# Patient Record
Sex: Male | Born: 1970 | Race: Black or African American | Hispanic: No | Marital: Single | Smoking: Never smoker
Health system: Southern US, Community
[De-identification: ages and names within clinical notes are randomized; demographics above are authoritative.]

## PROBLEM LIST (undated history)

## (undated) DIAGNOSIS — K37 Unspecified appendicitis: Secondary | ICD-10-CM

## (undated) HISTORY — DX: Unspecified appendicitis: K37

---

## 2016-08-31 ENCOUNTER — Encounter
Admission: EM | Disposition: A | Discharge status: Home / Self Care | Payer: Self-pay | Source: Home / Self Care | Attending: Emergency Medicine

## 2016-08-31 ENCOUNTER — Observation Stay: Payer: PRIVATE HEALTH INSURANCE | Admitting: Anesthesiology

## 2016-08-31 ENCOUNTER — Observation Stay
Admission: EM | Admit: 2016-08-31 | Discharge: 2016-09-01 | Disposition: A | Discharge status: Home / Self Care | Payer: PRIVATE HEALTH INSURANCE | Attending: Surgery | Admitting: Surgery

## 2016-08-31 ENCOUNTER — Emergency Department: Payer: PRIVATE HEALTH INSURANCE

## 2016-08-31 DIAGNOSIS — K3580 Unspecified acute appendicitis: Secondary | ICD-10-CM

## 2016-08-31 DIAGNOSIS — K3589 Other acute appendicitis: Principal | ICD-10-CM | POA: Insufficient documentation

## 2016-08-31 DIAGNOSIS — K353 Acute appendicitis with localized peritonitis, without perforation or gangrene: Secondary | ICD-10-CM

## 2016-08-31 DIAGNOSIS — I10 Essential (primary) hypertension: Secondary | ICD-10-CM | POA: Insufficient documentation

## 2016-08-31 DIAGNOSIS — K37 Unspecified appendicitis: Secondary | ICD-10-CM

## 2016-08-31 HISTORY — DX: Unspecified appendicitis: K37

## 2016-08-31 HISTORY — PX: LAPAROSCOPIC APPENDECTOMY: SHX408

## 2016-08-31 LAB — COMPREHENSIVE METABOLIC PANEL
ALT: 23 U/L (ref 17–63)
ANION GAP: 6 (ref 5–15)
AST: 26 U/L (ref 15–41)
Albumin: 4.3 g/dL (ref 3.5–5.0)
Alkaline Phosphatase: 57 U/L (ref 38–126)
BUN: 12 mg/dL (ref 6–20)
CHLORIDE: 104 mmol/L (ref 101–111)
CO2: 25 mmol/L (ref 22–32)
Calcium: 9.6 mg/dL (ref 8.9–10.3)
Creatinine, Ser: 1.2 mg/dL (ref 0.61–1.24)
Glucose, Bld: 116 mg/dL — ABNORMAL HIGH (ref 65–99)
Potassium: 3.6 mmol/L (ref 3.5–5.1)
Sodium: 135 mmol/L (ref 135–145)
Total Bilirubin: 1 mg/dL (ref 0.3–1.2)
Total Protein: 8.3 g/dL — ABNORMAL HIGH (ref 6.5–8.1)

## 2016-08-31 LAB — URINALYSIS, COMPLETE (UACMP) WITH MICROSCOPIC
BACTERIA UA: NONE SEEN
Bilirubin Urine: NEGATIVE
Glucose, UA: NEGATIVE mg/dL
Ketones, ur: NEGATIVE mg/dL
LEUKOCYTES UA: NEGATIVE
Nitrite: NEGATIVE
PH: 5 (ref 5.0–8.0)
Protein, ur: NEGATIVE mg/dL
SQUAMOUS EPITHELIAL / LPF: NONE SEEN
Specific Gravity, Urine: 1.016 (ref 1.005–1.030)

## 2016-08-31 LAB — CBC
HEMATOCRIT: 43.7 % (ref 40.0–52.0)
Hemoglobin: 14.7 g/dL (ref 13.0–18.0)
MCH: 30.1 pg (ref 26.0–34.0)
MCHC: 33.6 g/dL (ref 32.0–36.0)
MCV: 89.6 fL (ref 80.0–100.0)
PLATELETS: 203 10*3/uL (ref 150–440)
RBC: 4.88 MIL/uL (ref 4.40–5.90)
RDW: 14.1 % (ref 11.5–14.5)
WBC: 11.9 10*3/uL — AB (ref 3.8–10.6)

## 2016-08-31 LAB — LIPASE, BLOOD: LIPASE: 18 U/L (ref 11–51)

## 2016-08-31 SURGERY — APPENDECTOMY, LAPAROSCOPIC
Anesthesia: General

## 2016-08-31 MED ORDER — MORPHINE SULFATE (PF) 4 MG/ML IV SOLN
4.0000 mg | INTRAVENOUS | Status: DC | PRN
  Administered 2016-08-31: 4 mg via INTRAVENOUS

## 2016-08-31 MED ORDER — IOPAMIDOL (ISOVUE-300) INJECTION 61%
30.0000 mL | Freq: Once | INTRAVENOUS | Status: AC | PRN
  Administered 2016-08-31: 30 mL via ORAL

## 2016-08-31 MED ORDER — ONDANSETRON HCL 4 MG/2ML IJ SOLN
4.0000 mg | Freq: Four times a day (QID) | INTRAMUSCULAR | Status: DC | PRN
  Administered 2016-08-31 (×2): 4 mg via INTRAVENOUS

## 2016-08-31 MED ORDER — ONDANSETRON HCL 4 MG/2ML IJ SOLN
INTRAMUSCULAR | Status: AC
  Filled 2016-08-31: qty 2

## 2016-08-31 MED ORDER — MORPHINE SULFATE (PF) 4 MG/ML IV SOLN
INTRAVENOUS | Status: AC
  Administered 2016-08-31: 4 mg via INTRAVENOUS
  Filled 2016-08-31: qty 1

## 2016-08-31 MED ORDER — FENTANYL CITRATE (PF) 100 MCG/2ML IJ SOLN
INTRAMUSCULAR | Status: AC
  Filled 2016-08-31: qty 2

## 2016-08-31 MED ORDER — KETOROLAC TROMETHAMINE 30 MG/ML IJ SOLN
INTRAMUSCULAR | Status: AC
  Filled 2016-08-31: qty 1

## 2016-08-31 MED ORDER — DIPHENHYDRAMINE HCL 50 MG/ML IJ SOLN: 25.0000 mg | Freq: Four times a day (QID) | INTRAMUSCULAR | Status: DC | PRN

## 2016-08-31 MED ORDER — ESMOLOL HCL 100 MG/10ML IV SOLN
INTRAVENOUS | Status: DC | PRN
  Administered 2016-08-31: 30 mg via INTRAVENOUS

## 2016-08-31 MED ORDER — ONDANSETRON HCL 4 MG/2ML IJ SOLN: 4.0000 mg | Freq: Once | INTRAMUSCULAR | Status: DC | PRN

## 2016-08-31 MED ORDER — HEPARIN SODIUM (PORCINE) 5000 UNIT/ML IJ SOLN
5000.0000 [IU] | Freq: Three times a day (TID) | INTRAMUSCULAR | Status: DC
  Administered 2016-08-31 – 2016-09-01 (×3): 5000 [IU] via SUBCUTANEOUS
  Filled 2016-08-31 (×3): qty 1

## 2016-08-31 MED ORDER — BUPIVACAINE HCL (PF) 0.5 % IJ SOLN
INTRAMUSCULAR | Status: AC
  Filled 2016-08-31: qty 30

## 2016-08-31 MED ORDER — SUGAMMADEX SODIUM 200 MG/2ML IV SOLN
INTRAVENOUS | Status: DC | PRN
  Administered 2016-08-31: 200 mg via INTRAVENOUS

## 2016-08-31 MED ORDER — MIDAZOLAM HCL 2 MG/2ML IJ SOLN
INTRAMUSCULAR | Status: DC | PRN
  Administered 2016-08-31: 2 mg via INTRAVENOUS

## 2016-08-31 MED ORDER — EPINEPHRINE PF 1 MG/ML IJ SOLN
INTRAMUSCULAR | Status: AC
  Filled 2016-08-31: qty 1

## 2016-08-31 MED ORDER — LIDOCAINE HCL (CARDIAC) 20 MG/ML IV SOLN
INTRAVENOUS | Status: DC | PRN
  Administered 2016-08-31: 40 mg via INTRAVENOUS

## 2016-08-31 MED ORDER — FENTANYL CITRATE (PF) 100 MCG/2ML IJ SOLN
25.0000 ug | INTRAMUSCULAR | Status: DC | PRN
  Administered 2016-08-31 (×2): 25 ug via INTRAVENOUS

## 2016-08-31 MED ORDER — HYDROMORPHONE HCL 1 MG/ML IJ SOLN
0.5000 mg | INTRAMUSCULAR | Status: DC | PRN
  Administered 2016-08-31: 0.5 mg via INTRAVENOUS
  Filled 2016-08-31: qty 0.5

## 2016-08-31 MED ORDER — KETOROLAC TROMETHAMINE 30 MG/ML IJ SOLN
30.0000 mg | Freq: Four times a day (QID) | INTRAMUSCULAR | Status: DC
  Administered 2016-08-31 – 2016-09-01 (×3): 30 mg via INTRAVENOUS
  Filled 2016-08-31 (×3): qty 1

## 2016-08-31 MED ORDER — LISINOPRIL 10 MG PO TABS
10.0000 mg | ORAL_TABLET | Freq: Every day | ORAL | Status: DC
  Administered 2016-09-01: 10 mg via ORAL
  Filled 2016-08-31: qty 1

## 2016-08-31 MED ORDER — LACTATED RINGERS IV SOLN
INTRAVENOUS | Status: DC
  Administered 2016-08-31: 10:00:00 via INTRAVENOUS

## 2016-08-31 MED ORDER — ACETAMINOPHEN 500 MG PO TABS
1000.0000 mg | ORAL_TABLET | Freq: Four times a day (QID) | ORAL | Status: DC
  Administered 2016-08-31 – 2016-09-01 (×3): 1000 mg via ORAL
  Filled 2016-08-31 (×3): qty 2

## 2016-08-31 MED ORDER — IOPAMIDOL (ISOVUE-300) INJECTION 61%
100.0000 mL | Freq: Once | INTRAVENOUS | Status: AC | PRN
  Administered 2016-08-31: 100 mL via INTRAVENOUS

## 2016-08-31 MED ORDER — CHLORHEXIDINE GLUCONATE CLOTH 2 % EX PADS: 6.0000 | MEDICATED_PAD | Freq: Once | CUTANEOUS | Status: DC

## 2016-08-31 MED ORDER — OXYCODONE HCL 5 MG PO TABS: 5.0000 mg | ORAL_TABLET | ORAL | Status: DC | PRN

## 2016-08-31 MED ORDER — FENTANYL CITRATE (PF) 100 MCG/2ML IJ SOLN
INTRAMUSCULAR | Status: DC | PRN
  Administered 2016-08-31 (×2): 50 ug via INTRAVENOUS
  Administered 2016-08-31: 100 ug via INTRAVENOUS
  Administered 2016-08-31: 50 ug via INTRAVENOUS

## 2016-08-31 MED ORDER — MIDAZOLAM HCL 2 MG/2ML IJ SOLN
INTRAMUSCULAR | Status: AC
  Filled 2016-08-31: qty 2

## 2016-08-31 MED ORDER — ONDANSETRON 4 MG PO TBDP: 4.0000 mg | ORAL_TABLET | Freq: Four times a day (QID) | ORAL | Status: DC | PRN

## 2016-08-31 MED ORDER — DIPHENHYDRAMINE HCL 25 MG PO CAPS: 25.0000 mg | ORAL_CAPSULE | Freq: Four times a day (QID) | ORAL | Status: DC | PRN

## 2016-08-31 MED ORDER — POLYETHYLENE GLYCOL 3350 17 G PO PACK: 17.0000 g | PACK | Freq: Every day | ORAL | Status: DC | PRN

## 2016-08-31 MED ORDER — INFLUENZA VAC SPLIT QUAD 0.5 ML IM SUSY: 0.5000 mL | PREFILLED_SYRINGE | INTRAMUSCULAR | Status: DC

## 2016-08-31 MED ORDER — LACTATED RINGERS IV SOLN
125.0000 mL/h | INTRAVENOUS | Status: DC
  Administered 2016-08-31 – 2016-09-01 (×3): 125 mL/h via INTRAVENOUS

## 2016-08-31 MED ORDER — PROPOFOL 10 MG/ML IV BOLUS
INTRAVENOUS | Status: DC | PRN
  Administered 2016-08-31: 160 mg via INTRAVENOUS

## 2016-08-31 MED ORDER — PIPERACILLIN-TAZOBACTAM 3.375 G IVPB
3.3750 g | Freq: Three times a day (TID) | INTRAVENOUS | Status: DC
  Administered 2016-08-31: 3.375 g via INTRAVENOUS
  Filled 2016-08-31: qty 50

## 2016-08-31 MED ORDER — HYDRALAZINE HCL 20 MG/ML IJ SOLN: 10.0000 mg | INTRAMUSCULAR | Status: DC | PRN

## 2016-08-31 MED ORDER — PIPERACILLIN-TAZOBACTAM 3.375 G IVPB 30 MIN: 3.3750 g | Freq: Four times a day (QID) | INTRAVENOUS | Status: DC

## 2016-08-31 MED ORDER — ACETAMINOPHEN 10 MG/ML IV SOLN
INTRAVENOUS | Status: DC | PRN
  Administered 2016-08-31: 1000 mg via INTRAVENOUS

## 2016-08-31 MED ORDER — SUGAMMADEX SODIUM 200 MG/2ML IV SOLN
INTRAVENOUS | Status: AC
  Filled 2016-08-31: qty 2

## 2016-08-31 MED ORDER — BUPIVACAINE-EPINEPHRINE (PF) 0.5% -1:200000 IJ SOLN
INTRAMUSCULAR | Status: DC | PRN
  Administered 2016-08-31: 20 mL

## 2016-08-31 MED ORDER — KETOROLAC TROMETHAMINE 30 MG/ML IJ SOLN
INTRAMUSCULAR | Status: DC | PRN
  Administered 2016-08-31: 30 mg via INTRAVENOUS

## 2016-08-31 MED ORDER — ROCURONIUM BROMIDE 100 MG/10ML IV SOLN
INTRAVENOUS | Status: DC | PRN
  Administered 2016-08-31: 40 mg via INTRAVENOUS
  Administered 2016-08-31: 10 mg via INTRAVENOUS

## 2016-08-31 MED ORDER — ONDANSETRON HCL 4 MG/2ML IJ SOLN: 4.0000 mg | Freq: Four times a day (QID) | INTRAMUSCULAR | Status: DC | PRN

## 2016-08-31 MED ORDER — ACETAMINOPHEN 10 MG/ML IV SOLN
INTRAVENOUS | Status: AC
  Filled 2016-08-31: qty 100

## 2016-08-31 MED ORDER — PROPOFOL 10 MG/ML IV BOLUS
INTRAVENOUS | Status: AC
  Filled 2016-08-31: qty 20

## 2016-08-31 MED ORDER — DEXAMETHASONE SODIUM PHOSPHATE 10 MG/ML IJ SOLN
INTRAMUSCULAR | Status: DC | PRN
  Administered 2016-08-31: 10 mg via INTRAVENOUS

## 2016-08-31 SURGICAL SUPPLY — 38 items
CANISTER SUCT 1200ML W/VALVE (MISCELLANEOUS) ×3 IMPLANT
CHLORAPREP W/TINT 26ML (MISCELLANEOUS) ×3 IMPLANT
CUTTER FLEX LINEAR 45M (STAPLE) IMPLANT
DERMABOND ADVANCED (GAUZE/BANDAGES/DRESSINGS) ×2
DERMABOND ADVANCED .7 DNX12 (GAUZE/BANDAGES/DRESSINGS) ×1 IMPLANT
ELECT CAUTERY BLADE 6.4 (BLADE) ×3 IMPLANT
ELECT REM PT RETURN 9FT ADLT (ELECTROSURGICAL) ×3
ELECTRODE REM PT RTRN 9FT ADLT (ELECTROSURGICAL) ×1 IMPLANT
ENDOPOUCH RETRIEVER 10 (MISCELLANEOUS) ×3 IMPLANT
GLOVE SURG SYN 7.0 (GLOVE) ×3 IMPLANT
GLOVE SURG SYN 7.5  E (GLOVE) ×2
GLOVE SURG SYN 7.5 E (GLOVE) ×1 IMPLANT
GOWN STRL REUS W/ TWL LRG LVL3 (GOWN DISPOSABLE) ×2 IMPLANT
GOWN STRL REUS W/TWL LRG LVL3 (GOWN DISPOSABLE) ×4
IRRIGATION STRYKERFLOW (MISCELLANEOUS) ×1 IMPLANT
IRRIGATOR STRYKERFLOW (MISCELLANEOUS) ×3
IV NS 1000ML (IV SOLUTION) ×2
IV NS 1000ML BAXH (IV SOLUTION) ×1 IMPLANT
KIT RM TURNOVER STRD PROC AR (KITS) ×3 IMPLANT
LABEL OR SOLS (LABEL) ×3 IMPLANT
LIGASURE MARYLAND LAP STAND (ELECTROSURGICAL) ×3 IMPLANT
NEEDLE HYPO 22GX1.5 SAFETY (NEEDLE) ×3 IMPLANT
NS IRRIG 500ML POUR BTL (IV SOLUTION) ×3 IMPLANT
PACK LAP CHOLECYSTECTOMY (MISCELLANEOUS) ×3 IMPLANT
PENCIL ELECTRO HAND CTR (MISCELLANEOUS) ×3 IMPLANT
RELOAD 45 VASCULAR/THIN (ENDOMECHANICALS) IMPLANT
RELOAD STAPLE TA45 3.5 REG BLU (ENDOMECHANICALS) ×3 IMPLANT
SCISSORS METZENBAUM CVD 33 (INSTRUMENTS) ×3 IMPLANT
SLEEVE ADV FIXATION 5X100MM (TROCAR) ×6 IMPLANT
SUT MNCRL 4-0 (SUTURE) ×2
SUT MNCRL 4-0 27XMFL (SUTURE) ×1
SUT VICRYL 0 AB UR-6 (SUTURE) ×3 IMPLANT
SUTURE MNCRL 4-0 27XMF (SUTURE) ×1 IMPLANT
TRAY FOLEY W/METER SILVER 16FR (SET/KITS/TRAYS/PACK) ×3 IMPLANT
TROCAR 130MM GELPORT  DAV (MISCELLANEOUS) IMPLANT
TROCAR XCEL BLUNT TIP 100MML (ENDOMECHANICALS) ×3 IMPLANT
TROCAR Z-THREAD OPTICAL 5X100M (TROCAR) ×3 IMPLANT
TUBING INSUFFLATOR HI FLOW (MISCELLANEOUS) ×3 IMPLANT

## 2016-08-31 NOTE — ED Notes (Signed)
ED Provider at bedside. 

## 2016-08-31 NOTE — Transfer of Care (Signed)
Immediate Anesthesia Transfer of Care Note  Patient: Mason Howard  Procedure(s) Performed: Procedure(s): APPENDECTOMY LAPAROSCOPIC (N/A)  Patient Location: PACU  Anesthesia Type:General  Level of Consciousness: sedated  Airway & Oxygen Therapy: Patient Spontanous Breathing and Patient connected to face mask oxygen  Post-op Assessment: Report given to RN and Post -op Vital signs reviewed and stable  Post vital signs: Reviewed and stable  Last Vitals:  Vitals:   08/31/16 0700 08/31/16 0830  BP: (!) 136/96 124/88  Pulse: 69 (!) 59  Resp: 16 16  Temp:      Last Pain:  Vitals:   08/31/16 0540  TempSrc:   PainSc: 10-Worst pain ever         Complications: No apparent anesthesia complications

## 2016-08-31 NOTE — Interval H&P Note (Signed)
History and Physical Interval Note:  08/31/2016 9:20 AM  Mason CarmineNicholas Roussel  has presented today for surgery, with the diagnosis of appendicitis  The various methods of treatment have been discussed with the patient and family. After consideration of risks, benefits and other options for treatment, the patient has consented to  Procedure(s): APPENDECTOMY LAPAROSCOPIC (N/A) as a surgical intervention .  The patient's history has been reviewed, patient examined, no change in status, stable for surgery.  I have reviewed the patient's chart and labs.  Questions were answered to the patient's satisfaction.     Stanlee Roehrig

## 2016-08-31 NOTE — ED Notes (Signed)
In at RN Dewayne HatchAnn request to perform EKG

## 2016-08-31 NOTE — OR Nursing (Signed)
Patient abd dressing x3 dermabond in place clean and dry

## 2016-08-31 NOTE — ED Notes (Signed)
Patient returned from CT

## 2016-08-31 NOTE — ED Triage Notes (Signed)
Pt ambulatory to triage with no difficulty. Pt reports abd pain that started around 10 am. Reports pain goes up from the epigastric area and that his abd feels bloated. +nausea denies vomiting.

## 2016-08-31 NOTE — ED Notes (Signed)
Patient transported to CT 

## 2016-08-31 NOTE — Anesthesia Postprocedure Evaluation (Signed)
Anesthesia Post Note  Patient: Mason Howard  Procedure(s) Performed: Procedure(s) (LRB): APPENDECTOMY LAPAROSCOPIC (N/A)  Patient location during evaluation: PACU Anesthesia Type: General Level of consciousness: awake and alert Pain management: pain level controlled Vital Signs Assessment: post-procedure vital signs reviewed and stable Respiratory status: spontaneous breathing and respiratory function stable Cardiovascular status: stable Anesthetic complications: no     Last Vitals:  Vitals:   08/31/16 0830 08/31/16 1115  BP: 124/88 (!) 143/93  Pulse: (!) 59 86  Resp: 16 (!) 27  Temp:  36.6 C    Last Pain:  Vitals:   08/31/16 1115  TempSrc:   PainSc: Asleep                 [name]

## 2016-08-31 NOTE — ED Notes (Signed)
Pt states he has had nausea only for the last 2 days and has a frontal HA when his nausea is at its worst.

## 2016-08-31 NOTE — Anesthesia Preprocedure Evaluation (Signed)
Anesthesia Evaluation  Patient identified by MRN, date of birth, ID band Patient awake    Reviewed: Allergy & Precautions, NPO status , Patient's Chart, lab work & pertinent test results  History of Anesthesia Complications Negative for: history of anesthetic complications  Airway Mallampati: II       Dental   Pulmonary neg pulmonary ROS,           Cardiovascular hypertension, Pt. on medications      Neuro/Psych negative neurological ROS     GI/Hepatic negative GI ROS, Neg liver ROS,   Endo/Other  negative endocrine ROS  Renal/GU negative Renal ROS     Musculoskeletal   Abdominal   Peds  Hematology negative hematology ROS (+)   Anesthesia Other Findings   Reproductive/Obstetrics                             Anesthesia Physical Anesthesia Plan  ASA: II and emergent  Anesthesia Plan: General   Post-op Pain Management:    Induction: Intravenous  Airway Management Planned: Oral ETT  Additional Equipment:   Intra-op Plan:   Post-operative Plan:   Informed Consent: I have reviewed the patients History and Physical, chart, labs and discussed the procedure including the risks, benefits and alternatives for the proposed anesthesia with the patient or authorized representative who has indicated his/her understanding and acceptance.     Plan Discussed with:   Anesthesia Plan Comments:         Anesthesia Quick Evaluation

## 2016-08-31 NOTE — ED Provider Notes (Signed)
[facility] Emergency Department Provider Note ____   First MD Initiated Contact with Patient 08/31/16 [phone]     (approximate)  I have reviewed the triage vital signs and the nursing notes.   HISTORY  Chief Complaint Abdominal Pain    HPI Mason Howard is a 46 y.o. male presents with acute onset of generalized abdominal pain at the patient states is currently 10 out of 10 associated with nausea however no vomiting which started today. Patient denies any aggravating or alleviating factors. Patient denies any diarrhea or constipation. Patient denies any urinary symptoms.   Past medical history No pertinent past medical history There are no active problems to display for this patient.   Past surgical history None  Prior to Admission medications   Not on File    Allergies No known drug allergies No family history on file.  Social History Social History  Substance Use Topics  . Smoking status: Not on file  . Smokeless tobacco: Not on file  . Alcohol use Not on file    Review of Systems Constitutional: No fever/chills Eyes: No visual changes. ENT: No sore throat. Cardiovascular: Denies chest pain. Respiratory: Denies shortness of breath. Gastrointestinal: No abdominal pain.  No nausea, no vomiting.  No diarrhea.  No constipation. Genitourinary: Negative for dysuria. Musculoskeletal: Negative for back pain. Skin: Negative for rash. Neurological: Negative for headaches, focal weakness or numbness.  10-point ROS otherwise negative.  ____________________________________________   PHYSICAL EXAM:  VITAL SIGNS: ED Triage Vitals  Enc Vitals Group     BP 08/31/16 0024 (!) 140/99     Pulse Rate 08/31/16 0024 75     Resp 08/31/16 0024 18     Temp 08/31/16 0024 99.1 F (37.3 C)     Temp Source 08/31/16 0024 Oral     SpO2 08/31/16 0024 98 %     Weight 08/31/16 0025 198 lb (89.8 kg)     Height 08/31/16 0025 5\' 7"  (1.702 m)     Head  Circumference --      Peak Flow --      Pain Score 08/31/16 0025 10     Pain Loc --      Pain Edu? --      Excl. in GC? --     Constitutional: Alert and oriented. Well appearing and in no acute distress. Eyes: Conjunctivae are normal. PERRL. EOMI. Head: Atraumatic. Mouth/Throat: Mucous membranes are moist.  Oropharynx non-erythematous. Neck: No stridor.  No meningeal signs.   Cardiovascular: Normal rate, regular rhythm. Good peripheral circulation. Grossly normal heart sounds. Respiratory: Normal respiratory effort.  No retractions. Lungs CTAB. Gastrointestinal: Soft and nontender. No distention.  Musculoskeletal: No lower extremity tenderness nor edema. No gross deformities of extremities. Neurologic:  Normal speech and language. No gross focal neurologic deficits are appreciated.  Skin:  Skin is warm, dry and intact. No rash noted. Psychiatric: Mood and affect are normal. Speech and behavior are normal.  ____________________________________________   LABS (all labs ordered are listed, but only abnormal results are displayed)  Labs Reviewed  COMPREHENSIVE METABOLIC PANEL - Abnormal; Notable for the following:       Result Value   Glucose, Bld 116 (*)    Total Protein 8.3 (*)    All other components within normal limits  CBC - Abnormal; Notable for the following:    WBC 11.9 (*)    All other components within normal limits  URINALYSIS, COMPLETE (UACMP) WITH MICROSCOPIC - Abnormal; Notable for the following:  Color, Urine YELLOW (*)    APPearance CLEAR (*)    Hgb urine dipstick SMALL (*)    All other components within normal limits  LIPASE, BLOOD     Procedures    INITIAL IMPRESSION / ASSESSMENT AND PLAN / ED COURSE  Pertinent labs & imaging results that were available during my care of the patient were reviewed by me and considered in my medical decision making (see chart for details).  Patient discussed with appendicitis discussed with Dr. Excell Seltzer general  surgeon on call.   Clinical Course     ____________________________________________  FINAL CLINICAL IMPRESSION(S) / ED DIAGNOSES  Final diagnoses:  Acute appendicitis, unspecified acute appendicitis type     MEDICATIONS GIVEN DURING THIS VISIT:  Medications - No data to display   NEW OUTPATIENT MEDICATIONS STARTED DURING THIS VISIT:  New Prescriptions   No medications on file    Modified Medications   No medications on file    Discontinued Medications   No medications on file     Note:  This document was prepared using Dragon voice recognition software and may include unintentional dictation errors.    Darci Current, MD 08/31/16 [phone]

## 2016-08-31 NOTE — OR Nursing (Signed)
Patient dressing dermabond dry and intact

## 2016-08-31 NOTE — Anesthesia Procedure Notes (Signed)
Procedure Name: Intubation Date/Time: 08/31/2016 9:45 AM Performed by: Allean Found Pre-anesthesia Checklist: Patient identified, Emergency Drugs available, Suction available, Patient being monitored and Timeout performed Patient Re-evaluated:Patient Re-evaluated prior to inductionOxygen Delivery Method: Circle system utilized Preoxygenation: Pre-oxygenation with 100% oxygen Intubation Type: IV induction Ventilation: Oral airway inserted - appropriate to patient size Laryngoscope Size: Mac and 4 Grade View: Grade I Tube type: Oral Tube size: 7.5 mm Number of attempts: 1 Airway Equipment and Method: Stylet Placement Confirmation: ETT inserted through vocal cords under direct vision,  positive ETCO2 and breath sounds checked- equal and bilateral Secured at: 25 cm Tube secured with: Tape Dental Injury: Teeth and Oropharynx as per pre-operative assessment

## 2016-08-31 NOTE — ED Notes (Signed)
Patients friend came out and asked if he could have something for pain.  We are pulling from Dr. Joseph ArtWoods orders for 4mg  morphine IV, and 4mg  Zofran IV and will administer to patient.

## 2016-08-31 NOTE — Op Note (Signed)
  Procedure Date:  08/31/2016  Pre-operative Diagnosis:  Acute appendicitis  Post-operative Diagnosis:  Acute appendicitis  Procedure:  Laparoscopic appendectomy  Surgeon:  Howie IllJose Luis Jerricka Carvey, MD  Anesthesia:  General endotracheal  Estimated Blood Loss:  10 ml  Specimens:  appendix  Complications:  None  Indications for Procedure:  This is a 46 y.o. male who presents with abdominal pain and workup revealing acute appendicitis.  The options of surgery versus observation were reviewed with the patient and/or family. The risks of bleeding, infection, recurrence of symptoms, negative laparoscopy, potential for an open procedure, bowel injury, abscess or infection, were all discussed with the patient and he was willing to proceed.  Description of Procedure: The patient was correctly identified in the preoperative area and brought into the operating room.  The patient was placed supine with VTE prophylaxis in place.  Appropriate time-outs were performed.  Anesthesia was induced and the patient was intubated.  Foley catheter was placed.  Appropriate antibiotics were infused.  The abdomen was prepped and draped in a sterile fashion. An infraumbilical incision was made. A cutdown technique was used to enter the abdominal cavity without injury, and a Hasson trocar was inserted.  Hemostasis was achieved with electrocautery. Pneumoperitoneum was obtained with appropriate opening pressures.  Two 5-mm ports were placed in the suprapubic and left lateral positions under direct visualization.  The right lower quadrant was inspected and the appendix was identified and found to be acutely inflamed.  The appendix was carefully dissected. The base of the appendix was dissected out and divided with a standard blue load Endo GIA. The mesoappendix was divided using the LigaSure.  The appendix was placed in an Endocatch bag and brought out through the umbilical incision.  The right lower quadrant was then inspected  revealing mild oozing from the staple line, which was controlled with 3 small clips.  Further inspection showed an intact staple line, no bleeding, and no bowel injury.  The area was thoroughly irrigated.  The 5 mm ports were removed under direct visualization and the Hasson trocar was removed.  The fascial opening was closed using 0 vicryl suture.  Local anesthetic was infused in all incisions and the incisions were closed with 4-0 Monocryl.  The wounds were cleaned and sealed with DermaBond.  Foley catheter was removed and the patient was emerged from anesthesia and extubated and brought to the recovery room for further management.  The patient tolerated the procedure well and all counts were correct at the end of the case.   Howie IllJose Luis Xiamara Hulet, MD

## 2016-08-31 NOTE — H&P (Signed)
Patient ID: Mason Howard, male   DOB: 07/30/1971, 46 y.o.   MRN: [id number]  CC: Abdominal Pain  HPI Mason Howard is a 46 y.o. male who presents to the emergency department with a one-day history of abdominal pain. Patient states that it started in the middle of his abdomen and then gradually moved to the worse of the pain was in the right lower quadrant. He is never had anything like this before and nothing he did made it any better. He didn't positions, he doesn't eat, over-the-counter medications without success. The pain gradually worsened to the point where he came to the emergency room. He has had subjective fevers but no chills. He has had nausea but no vomiting. He denies any chest pain, shortness of breath, diarrhea, constipation. He is otherwise in his usual state of excellent health, currently on no medications and having had 0 surgeries before.  HPI  History reviewed. No pertinent past medical history. No active diagnoses, no active medications.  No past surgical history on file. Patient has never had surgery.  No family history on file. Patient denies any knowledge of a family history of cancer, diabetes, heart disease.  Social History Social History  Substance Use Topics  . Smoking status: Not on file  . Smokeless tobacco: Not on file  . Alcohol use Not on file    No Known Allergies  Current Facility-Administered Medications  Medication Dose Route Frequency Provider Last Rate Last Dose  . lactated ringers infusion   Intravenous Continuous Ricarda Frameharles Amore Grater, MD      . piperacillin-tazobactam (ZOSYN) IVPB 3.375 g  3.375 g Intravenous Q6H Ricarda Frameharles Kalianna Verbeke, MD       No current outpatient prescriptions on file.     Review of Systems A Multi-point review of systems was asked and was negative except for the findings documented in the history of present illness  Physical Exam Blood pressure (!) 146/99, pulse 65, temperature 99.1 F (37.3 C), temperature source Oral,  resp. rate 18, height 5\' 7"  (1.702 m), weight 89.8 kg (198 lb), SpO2 96 %. CONSTITUTIONAL: No acute distress. EYES: Pupils are equal, round, and reactive to light, Sclera are non-icteric. EARS, NOSE, MOUTH AND THROAT: The oropharynx is clear. The oral mucosa is pink and moist. Hearing is intact to voice. LYMPH NODES:  Lymph nodes in the neck are normal. RESPIRATORY:  Lungs are clear. There is normal respiratory effort, with equal breath sounds bilaterally, and without pathologic use of accessory muscles. CARDIOVASCULAR: Heart is regular without murmurs, gallops, or rubs. GI: The abdomen is soft, tender to palpation in all quadrants but worse in the right lower quadrant at McBurney's point with a positive Rovsing and psoas sign, and nondistended. There are no palpable masses. There is no hepatosplenomegaly. There are normal bowel sounds in all quadrants. GU: Rectal deferred.   MUSCULOSKELETAL: Normal muscle strength and tone. No cyanosis or edema.   SKIN: Turgor is good and there are no pathologic skin lesions or ulcers. NEUROLOGIC: Motor and sensation is grossly normal. Cranial nerves are grossly intact. PSYCH:  Oriented to person, place and time. Affect is normal.  Data Reviewed Images and labs reviewed. Labs are concerning for leukocytosis of 11.9. The remainder of his labs are otherwise within normal limits. CT scan reviewed which shows a dilated, thickened, inflamed appendix in the right lower quadrant with some minimal free fluid in the pelvis. There is periappendiceal stranding but there is no evidence of air within the abdomen. I have personally reviewed the patient's  imaging, laboratory findings and medical records.    Assessment    Acute appendicitis    Plan    46 year old male with acute appendicitis. Discussed the diagnosis in detail with the patient and his wife. Provided with the treatment options of going straight to the operating room versus admission for antibiotics.  Discussed the risks, benefits, alternatives both of these. After this discussion the patient and his wife stated they would prefer to go to surgery of the appendix removed. The procedure for laparoscopic appendectomy was described in detail to include the risks, benefits, alternatives. The primary risks were pain, bleeding, infection, possible need for more surgeries or conversion to an open procedure. They voice understanding and elected to proceed. Plan to proceed to the operating room for laparoscopic appendectomy this morning with my partner Dr. Aleen Campi.     Time spent with the patient was 45 minutes, with more than 50% of the time spent in face-to-face education, counseling and care coordination.     Ricarda Frame, MD FACS General Surgeon 08/31/2016, 6:46 AM

## 2016-08-31 NOTE — ED Notes (Signed)
Ct notified patient is done with contrast 

## 2016-09-01 MED ORDER — OXYCODONE-ACETAMINOPHEN 7.5-325 MG PO TABS: 2.0000 | ORAL_TABLET | ORAL | 0 refills | Status: DC | PRN

## 2016-09-01 NOTE — Discharge Summary (Signed)
  Patient ID: Mason Howard MRN: [id number] DOB/AGE: 46/02/1971 45 y.o.  Admit date: 08/31/2016 Discharge date: 09/01/2016   Discharge Diagnoses:  Active Problems:   Acute appendicitis with localized peritonitis   Procedures: Laparoscopic Appendectomy  Hospital Course: 46 yo admitted with abdominal pain clinical sings c/w appendicitis and confirmed with a CT scan. He was Promptly taking to the OR for an appendectomy. He had an uneventful postop course. At the time of DC he was ambulating , tolerating PO and his vitals were stable. His PE showed a male in NAD, awake, alert. Abd: soft, Incisions c/d/i, no infection. Ext: no edema and well perfused. Condition at the time of DC is stable.  Disposition: Final discharge disposition not confirmed  Discharge Instructions    Call MD for:  difficulty breathing, headache or visual disturbances    Complete by:  As directed    Call MD for:  extreme fatigue    Complete by:  As directed    Call MD for:  hives    Complete by:  As directed    Call MD for:  persistant dizziness or light-headedness    Complete by:  As directed    Call MD for:  persistant nausea and vomiting    Complete by:  As directed    Call MD for:  redness, tenderness, or signs of infection (pain, swelling, redness, odor or green/yellow discharge around incision site)    Complete by:  As directed    Call MD for:  severe uncontrolled pain    Complete by:  As directed    Call MD for:  temperature >100.4    Complete by:  As directed    Diet - low sodium heart healthy    Complete by:  As directed    Discharge instructions    Complete by:  As directed    Shower starting tomorrow   Please provide pt with work excuse for 1 week Until 09/09/16 ( may RTW w lifting restrictions)   Increase activity slowly    Complete by:  As directed    Lifting restrictions    Complete by:  As directed    20 lbs x 6 wks   No wound care    Complete by:  As directed      Allergies as of  09/01/2016   No Known Allergies     Medication List    TAKE these medications   lisinopril 10 MG tablet Commonly known as:  PRINIVIL,ZESTRIL Take 10 mg by mouth daily.   oxyCODONE-acetaminophen 7.5-325 MG tablet Commonly known as:  PERCOCET Take 2 tablets by mouth every 4 (four) hours as needed for severe pain (May take 1 tab for moderate pain).      Follow-up Information    Henrene DodgeJose Piscoya, MD Follow up in 10 day(s).   Specialty:  Surgery Contact information: [address] [place] [address] [phone]            Sterling Bigiego Arrin Ishler, MD FACS

## 2016-09-01 NOTE — Progress Notes (Signed)
Pt d/c home; d/c instructions reviewed w/ pt; pt understanding was verbalized; IV removed, catheter in tact, gauze dressing applied; all pt questions answered; incisions and dressings  CDI/WDL at d/c; pt verbalized that all pt belongings were accounted for; pt left unit via wheelchair accompanied by staff

## 2016-09-01 NOTE — Discharge Instructions (Signed)
Laparoscopic Appendectomy  °Care After  ° ° °These instructions give you information on caring for yourself after your procedure. Your doctor may also give you more specific instructions. Call your doctor if you have any problems or questions after your procedure.  °HOME CARE  °Do not drive while taking pain medicine (narcotics).  °Take medicine (stool softener) if you cannot poop (constipated).  °Change your bandages (dressings) as told by your doctor.  °Keep your wounds clean and dry. Wash the wounds gently with soap and water. Gently pat the wounds dry with a clean towel.  °Do not take baths, swim, or use hot tubs for 10 days, or as told by your doctor.  °Only take medicine as told by your doctor.  °Continue your normal diet as told by your doctor.  °Do not lift more than 10 pounds (4.5 kilograms) or play contact sports for 3 weeks, or as told by your doctor.  °Slowly increase your activity.  °Take deep breaths to avoid a lung infection (pneumonia). °GET HELP RIGHT AWAY IF:  °You have a fever >101 °You have a rash.  °You have trouble breathing or sharp chest pain.  °You have a reaction to the medicine you are taking.  °Your wound is red, puffy (swollen), or painful.  °You have yellowish-white fluid (pus) coming from the wound.  °You have fluid coming from the wound for longer than 1 day.  °You notice a bad smell coming from the wound or bandage.  °Your wound breaks open after stitches (sutures) or staples are removed.  °You have pain in the shoulders or shoulder blades.   °You are short of breath.  °You feel sick to your stomach (nauseous) or throw up (vomit).  °MAKE SURE YOU:  °Understand these instructions.  °Will watch your condition.  °Will get help right away if you are not doing well or get worse. ° °

## 2016-09-02 ENCOUNTER — Encounter: Payer: Self-pay | Admitting: Surgery

## 2016-09-03 LAB — SURGICAL PATHOLOGY

## 2016-09-10 ENCOUNTER — Ambulatory Visit (INDEPENDENT_AMBULATORY_CARE_PROVIDER_SITE_OTHER): Payer: PRIVATE HEALTH INSURANCE | Admitting: Surgery

## 2016-09-10 ENCOUNTER — Encounter: Payer: Self-pay | Admitting: Surgery

## 2016-09-10 VITALS — BP 121/78 | HR 70 | Temp 99.0°F | Ht 67.0 in | Wt 203.2 lb

## 2016-09-10 DIAGNOSIS — Z9049 Acquired absence of other specified parts of digestive tract: Secondary | ICD-10-CM | POA: Insufficient documentation

## 2016-09-10 DIAGNOSIS — Z8719 Personal history of other diseases of the digestive system: Secondary | ICD-10-CM

## 2016-09-10 NOTE — Progress Notes (Signed)
09/10/2016  HPI: Patient is status post laparoscopic appendectomy on 1/13. She presents today for postop follow-up. He reports that he is doing well with no issues with the incisions. He has been eating well with regular bowel movements and no nausea or vomiting or worsening abdominal pain.  Vital signs: BP 121/78   Pulse 70   Temp 99 F (37.2 C) (Oral)   Ht 5\' 7"  (1.702 m)   Wt 92.2 kg (203 lb 3.2 oz)   BMI 31.83 kg/m    Physical Exam: Constitutional: No acute distress Abdomen:  Soft, nondistended, nontender to palpation. Incisions are clean dry and intact and healing very well with no evidence of infection.  Assessment/Plan: 46 year old male status post laparoscopic appendectomy.  -Patient still has a no heavy lifting restriction of more than 10-15 pounds until 2/10. -Patient may return to work and a work note will be given to the patient today. -Patient may follow-up with us on an as-needed basis.   Howie IllJose Luis Meyer Arora, MD [facility]

## 2016-09-10 NOTE — Patient Instructions (Signed)
Please call our office with any questions or concerns.  Please do not submerge in a tub, hot tub, or pool until incisions are completely sealed.  Use sun block to incision area over the next year if this area will be exposed to sun. This helps decrease scarring.  You may resume your normal activities on 09/28/16. At that time- Listen to your body when lifting, if you have pain when lifting, stop and then try again in a few days. Pain after doing exercises or activities of daily living is normal as you get back in to your normal routine.  If you develop redness, drainage, or pain at incision sites- call our office immediately and speak with a nurse.

## 2017-02-10 ENCOUNTER — Encounter: Payer: Self-pay | Admitting: Family Medicine

## 2017-02-10 ENCOUNTER — Ambulatory Visit (INDEPENDENT_AMBULATORY_CARE_PROVIDER_SITE_OTHER): Payer: PRIVATE HEALTH INSURANCE | Admitting: Family Medicine

## 2017-02-10 VITALS — BP 110/80 | HR 77 | Temp 98.6°F | Resp 17 | Ht 67.0 in | Wt 210.0 lb

## 2017-02-10 DIAGNOSIS — Z7689 Persons encountering health services in other specified circumstances: Secondary | ICD-10-CM | POA: Diagnosis not present

## 2017-02-10 DIAGNOSIS — I10 Essential (primary) hypertension: Secondary | ICD-10-CM | POA: Diagnosis not present

## 2017-02-10 NOTE — Progress Notes (Signed)
Name: Mason Howard   MRN: [id number]    DOB: 04/05/1971   Date:02/10/2017       Progress Note  Subjective  Chief Complaint  Chief Complaint  Patient presents with  . Establish Care    Hypertension  This is a new problem. The current episode started more than 1 month ago. The problem has been gradually improving since onset. The problem is controlled. Associated symptoms include headaches. Pertinent negatives include no anxiety, blurred vision, chest pain, malaise/fatigue, neck pain, orthopnea, palpitations, peripheral edema, PND, shortness of breath or sweats. There are no associated agents to hypertension. Risk factors for coronary artery disease include obesity. Past treatments include ACE inhibitors and diuretics. The current treatment provides moderate improvement. There are no compliance problems.  There is no history of angina, kidney disease, CAD/MI, CVA, heart failure, left ventricular hypertrophy, PVD or retinopathy. There is no history of chronic renal disease, a hypertension causing med or renovascular disease.    No problem-specific Assessment & Plan notes found for this encounter.   Past Medical History:  Diagnosis Date  . Appendicitis 08/31/2016    Past Surgical History:  Procedure Laterality Date  . LAPAROSCOPIC APPENDECTOMY N/A 08/31/2016   Procedure: APPENDECTOMY LAPAROSCOPIC;  Surgeon: Henrene DodgeJose Piscoya, MD;  Location: [facility] ORS;  Service: General;  Laterality: N/A;    Family History  Problem Relation Age of Onset  . Healthy Mother   . Healthy Father     Social History   Social History  . Marital status: Single    Spouse name: N/A  . Number of children: N/A  . Years of education: N/A   Occupational History  . Not on file.   Social History Main Topics  . Smoking status: Never Smoker  . Smokeless tobacco: Never Used  . Alcohol use No  . Drug use: No  . Sexual activity: Not on file   Other Topics Concern  . Not on file   Social History Narrative  .  No narrative on file    No Known Allergies  Outpatient Medications Prior to Visit  Medication Sig Dispense Refill  . lisinopril (PRINIVIL,ZESTRIL) 10 MG tablet Take 10 mg by mouth daily.    Marland Kitchen. oxyCODONE-acetaminophen (PERCOCET) 7.5-325 MG tablet Take 2 tablets by mouth every 4 (four) hours as needed for severe pain (May take 1 tab for moderate pain). 30 tablet 0   No facility-administered medications prior to visit.     Review of Systems  Constitutional: Negative for chills, fever, malaise/fatigue and weight loss.  HENT: Negative for ear discharge, ear pain and sore throat.   Eyes: Negative for blurred vision.  Respiratory: Positive for cough. Negative for sputum production, shortness of breath and wheezing.        Tickling in throat  Cardiovascular: Negative for chest pain, palpitations, orthopnea, leg swelling and PND.  Gastrointestinal: Negative for abdominal pain, blood in stool, constipation, diarrhea, heartburn, melena and nausea.  Genitourinary: Negative for dysuria, frequency, hematuria and urgency.  Musculoskeletal: Negative for back pain, joint pain, myalgias and neck pain.  Skin: Negative for rash.  Neurological: Positive for headaches. Negative for dizziness, tingling, sensory change and focal weakness.  Endo/Heme/Allergies: Negative for environmental allergies and polydipsia. Does not bruise/bleed easily.  Psychiatric/Behavioral: Negative for depression and suicidal ideas. The patient is not nervous/anxious and does not have insomnia.      Objective  Vitals:   02/10/17 1426  BP: 110/80  Pulse: 77  Resp: 17  Temp: 98.6 F (37 C)  TempSrc: Oral  SpO2: 95%  Weight: 210 lb (95.3 kg)  Height: 5\' 7"  (1.702 m)    Physical Exam  Constitutional: He is oriented to person, place, and time and well-developed, well-nourished, and in no distress.  HENT:  Head: Normocephalic.  Right Ear: External ear normal.  Left Ear: External ear normal.  Nose: Nose normal.   Mouth/Throat: Oropharynx is clear and moist.  Eyes: Conjunctivae and EOM are normal. Pupils are equal, round, and reactive to light. Right eye exhibits no discharge. Left eye exhibits no discharge. No scleral icterus.  Neck: Normal range of motion. Neck supple. No JVD present. No tracheal deviation present. No thyromegaly present.  Cardiovascular: Normal rate, regular rhythm, normal heart sounds and intact distal pulses.  Exam reveals no gallop and no friction rub.   No murmur heard. Pulmonary/Chest: Breath sounds normal. No respiratory distress. He has no wheezes. He has no rales.  Abdominal: Soft. Bowel sounds are normal. He exhibits no mass. There is no hepatosplenomegaly. There is no tenderness. There is no rebound, no guarding and no CVA tenderness.  Musculoskeletal: Normal range of motion. He exhibits no edema or tenderness.  Lymphadenopathy:    He has no cervical adenopathy.  Neurological: He is alert and oriented to person, place, and time. He has normal sensation, normal strength and intact cranial nerves. No cranial nerve deficit.  Skin: Skin is warm. No rash noted.  Psychiatric: Mood and affect normal.  Nursing note and vitals reviewed.     Assessment & Plan  Problem List Items Addressed This Visit    None    Visit Diagnoses    Establishing care with new doctor, encounter for    -  Primary   Essential hypertension       recheck 6 wks /willd/c lisinopril due to cough      No orders of the defined types were placed in this encounter.     Dr. Hayden [facility]  02/10/17

## 2017-02-10 NOTE — Patient Instructions (Signed)
High Blood Pressure (Essential Hypertension)  What is essential hypertension?   Blood pressure is the force of the blood on the artery walls as the heart pumps blood through the body. Hypertension is the term for blood pressure that is consistently higher than normal. Hypertension is called essential or primary when no cause for the high blood pressure can be found. (When the cause of hypertension is known, such as kidney disease and tumors, it is called secondary hypertension.) About 95% of all people with high blood pressure have essential hypertension.   Normal blood pressure ranges up to 120/80 ("120 over 80") but blood pressure can rise and fall with exercise, rest, or emotions. The pressures are measured in millimeters of mercury. The upper number (120) is the pressure when the heart pushes blood out to the rest of the body (systolic pressure). The bottom number (80) is the pressure when the heart rests between beats (diastolic pressure).  . Healthy blood pressure is less than 120/80. . Pre-high blood pressure (prehypertension) is from 120/80 to 139/90. . Stage I high blood pressure ranges from 140/90 to 159/99. . Stage II high blood pressure is over 160/100.   If repeated checks of your blood pressure show that it is higher than 140/90, you have hypertension.   Why is high blood pressure a problem?   When your blood pressure is high, your heart has to work harder just to pump a normal amount of blood through your body. The higher pressure in your arteries may cause them to weaken and bleed, resulting in a stroke. Over time, blood vessels may become hardened. This often occurs as people age. High blood pressure speeds this process. Blood vessel damage is bad because hardened or narrowed arteries may be unable to supply the amount of blood the body's organs need. The higher artery pressure may lead to atherosclerosis, in which deposits of cholesterol, fatty substances, and blood cells clog up  an artery. Atherosclerosis is the leading cause of heart attacks. It can also cause strokes.   The added workload on the heart causes thickening of the heart muscle. Over time, the thickening damages the heart muscle so that it can no longer pump normally. This can lead to a disease called heart failure. Your kidneys or eyes may also be damaged. The longer you have high blood pressure and the higher it is, the more likely it is you will develop problems.          What is essential hypertension?   Blood pressure is the force of the blood on the artery walls as the heart pumps blood through the body. Hypertension is the term for blood pressure that is consistently higher than normal. Hypertension is called essential or primary when no cause for the high blood pressure can be found. (When the cause of hypertension is known, such as kidney disease and tumors, it is called secondary hypertension.) About 95% of all people with high blood pressure have essential hypertension.   Normal blood pressure ranges up to 120/80 ("120 over 80") but blood pressure can rise and fall with exercise, rest, or emotions. The pressures are measured in millimeters of mercury. The upper number (120) is the pressure when the heart pushes blood out to the rest of the body (systolic pressure). The bottom number (80) is the pressure when the heart rests between beats (diastolic pressure).  . Healthy blood pressure is less than 120/80. . Pre-high blood pressure (prehypertension) is from 120/80 to 139/90. Meta Hatchet I  high blood pressure ranges from 140/90 to 159/99. . Stage II high blood pressure is over 160/100.   If repeated checks of your blood pressure show that it is higher than 140/90, you have hypertension.   Why is high blood pressure a problem?   When your blood pressure is high, your heart has to work harder just to pump a normal amount of blood through your body. The higher pressure in your arteries may cause  them to weaken and bleed, resulting in a stroke. Over time, blood vessels may become hardened. This often occurs as people age. High blood pressure speeds this process. Blood vessel damage is bad because hardened or narrowed arteries may be unable to supply the amount of blood the body's organs need. The higher artery pressure may lead to atherosclerosis, in which deposits of cholesterol, fatty substances, and blood cells clog up an artery. Atherosclerosis is the leading cause of heart attacks. It can also cause strokes.   The added workload on the heart causes thickening of the heart muscle. Over time, the thickening damages the heart muscle so that it can no longer pump normally. This can lead to a disease called heart failure. Your kidneys or eyes may also be damaged. The longer you have high blood pressure and the higher it is, the more likely it is you will develop problems.            How does it occur?   There are no clear causes of essential hypertension. However, many different factors can increase blood pressure, such as:   . being overweight . smoking . eating a diet high in salt . drinking a lot of alcohol.   Other important factors include:  . Race. African Americans are more likely to develop high blood pressure. . Gender. Males have a greater chance of developing high blood pressure than women until age 30. However, after the age of 40, women are more likely to develop high blood pressure than men. Marland Kitchen Hered[employer]. If your parents had high blood pressure, you are more at risk. . Age. The older you get, the more likely you are to develop high blood pressure.   Some medicines increase blood pressure. Stress and drinking caffeine can make blood pressure go up for a while, but the long-term effects aren't yet clear.   What are the symptoms?   One of the sneaky things about high blood pressure is that you can have it for a long time without symptoms. That's why it is important  for you have your blood pressure checked at least once a year.   If you do have symptoms, they may be:   . headaches . getting tired easily . dizziness . nosebleeds . chest pain . shortness of breath.   Although it happens rarely, the first symptom may be a stroke.   How is it diagnosed?   Because it is such a common problem, blood pressure is checked at most health care visits. High blood pressure is usually discovered during one of these visits. If your blood pressure is high, you will be asked to return for follow-up checks. If your pressure stays high for 3 visits, you probably have hypertension.   Your health care provider will ask about your life situation, what you eat and drink, and if high blood pressure runs in your family. You may have urine and blood tests. Your provider may order a chest x-ray and an electrocardiogram (ECG). You may be asked to use a  portable blood-pressure measuring device, which will take your pressure at different times during day and night. All of this testing is done to look for a possible cause of your high blood pressure.   How is it treated?   If your blood pressure is above normal (prehypertension), you may be able to bring it down to a normal level without medicine. Weight loss, changes in your diet, and exercise may be the only treatment you need. If you also have diabetes, you may need additional treatment.   If these lifestyle changes do not lower your blood pressure enough, your health care provider may prescribe medicine. Some of the types of medicines that can help are diuretics, beta blockers, ACE inhibitors, calcium channel blockers, and vasodilators. These medicines work in different ways. Many people need to take two or more medicines to bring their blood pressure down to a healthy level.   When you start taking medicine, it is important to:   Marland Kitchen Take the medicine regularly, exactly as prescribed. . Tell your health care provider about any  side effects right away. . Have regular follow-up visits with your health care provider.   It may not be possible to know at first which drug or mix of drugs will work best for you. It may take several weeks or months to find the best treatment for you.   How long will the effects last?   You may need treatment for high blood pressure for the rest of your life. However, proper treatment can control your blood pressure and help prevent or delay problems. If you already have some complications, lowering your blood pressure may make their effects less severe.   How can I take care of myself?   Your treatment will be much more effective if you follow these guidelines:   . Always follow your health care provider's instructions for taking medicines. Don't take less medicine or stop taking medicine without talking to your provider first. It can be dangerous to suddenly stop taking blood pressure medicine. Also, do not increase your dosage of any medicine without first talking with your provider. . Check your blood pressure (or have it checked) as often as your health care provider advises. Keep a chart of the readings. . Do not smoke. . Follow the DASH diet. This diet is low in fat, cholesterol, red meat, and sweets. It emphasizes fruits, vegetables, and low-fat dairy foods. The DASH diet also includes whole-grain products, fish, poultry, and nuts. . Use less salt. Check the levels of sodium listed on food labels. Avoid canned and prepared foods unless the label says no salt is added. . Get regular exercise, according to your health care provider's advice. For example, you might walk, bike, or swim at least 30 minutes 3 to 5 times a week. . Limit the amount of alcohol you drink. Moderate drinking means up to 1 drink a day for women and up to 2 drinks for men. A drink equals 12 ounces of regular beer, 5 ounces of wine, or 1 and 1/2 ounces of 80-proof distilled spirits such as whiskey or vodka. . Limit  the amount of caffeine you drink. . Try to reduce the stress in your life or learn how to deal better with situations that make you feel anxious. . Ask your health care provider or pharmacist for information about the drugs you are taking. . Lose weight if you need to. . Tell your health care provider about any side effects you have from your  medicines.   Adult Health Advisor [phone]; Copyright  2006 [employer] and/or one of its subsidiaries. All Rights Reserved. Developed by Mindi Slicker. Coralie Common, MD; [employer] [place]. Burt Knack, RN, [place]; and [employer]. This content is reviewed periodically and is subject to change as new health information becomes available. The information is intended to inform and educate and is not a replacement for medical evaluation, advice, diagnosis or treatment by a healthcare professional.     How does it occur?   There are no clear causes of essential hypertension. However, many different factors can increase blood pressure, such as:   . being overweight . smoking . eating a diet high in salt . drinking a lot of alcohol.   Other important factors include:  . Race. African Americans are more likely to develop high blood pressure. . Gender. Males have a greater chance of developing high blood pressure than women until age 30. However, after the age of 30, women are more likely to develop high blood pressure than men. Marland Kitchen Heredity. If your parents had high blood pressure, you are more at risk. . Age. The older you get, the more likely you are to develop high blood pressure.   Some medicines increase blood pressure. Stress and drinking caffeine can make blood pressure go up for a while, but the long-term effects aren't yet clear.   What are the symptoms?   One of the sneaky things about high blood pressure is that you can have it for a long time without symptoms. That's why it is important for you have your blood pressure checked at least  once a year.   If you do have symptoms, they may be:   . headaches . getting tired easily . dizziness . nosebleeds . chest pain . shortness of breath.   Although it happens rarely, the first symptom may be a stroke.   How is it diagnosed?   Because it is such a common problem, blood pressure is checked at most health care visits. High blood pressure is usually discovered during one of these visits. If your blood pressure is high, you will be asked to return for follow-up checks. If your pressure stays high for 3 visits, you probably have hypertension.   Your health care provider will ask about your life situation, what you eat and drink, and if high blood pressure runs in your family. You may have urine and blood tests. Your provider may order a chest x-ray and an electrocardiogram (ECG). You may be asked to use a portable blood-pressure measuring device, which will take your pressure at different times during day and night. All of this testing is done to look for a possible cause of your high blood pressure.   How is it treated?   If your blood pressure is above normal (prehypertension), you may be able to bring it down to a normal level without medicine. Weight loss, changes in your diet, and exercise may be the only treatment you need. If you also have diabetes, you may need additional treatment.   If these lifestyle changes do not lower your blood pressure enough, your health care provider may prescribe medicine. Some of the types of medicines that can help are diuretics, beta blockers, ACE inhibitors, calcium channel blockers, and vasodilators. These medicines work in different ways. Many people need to take two or more medicines to bring their blood pressure down to a healthy level.   When you start taking medicine, it is important to:   .  Take the medicine regularly, exactly as prescribed. . Tell your health care provider about any side effects right away. . Have regular  follow-up visits with your health care provider.   It may not be possible to know at first which drug or mix of drugs will work best for you. It may take several weeks or months to find the best treatment for you.   How long will the effects last?   You may need treatment for high blood pressure for the rest of your life. However, proper treatment can control your blood pressure and help prevent or delay problems. If you already have some complications, lowering your blood pressure may make their effects less severe.   How can I take care of myself?   Your treatment will be much more effective if you follow these guidelines:   . Always follow your health care provider's instructions for taking medicines. Don't take less medicine or stop taking medicine without talking to your provider first. It can be dangerous to suddenly stop taking blood pressure medicine. Also, do not increase your dosage of any medicine without first talking with your provider. . Check your blood pressure (or have it checked) as often as your health care provider advises. Keep a chart of the readings. . Do not smoke. . Follow the DASH diet. This diet is low in fat, cholesterol, red meat, and sweets. It emphasizes fruits, vegetables, and low-fat dairy foods. The DASH diet also includes whole-grain products, fish, poultry, and nuts. . Use less salt. Check the levels of sodium listed on food labels. Avoid canned and prepared foods unless the label says no salt is added. . Get regular exercise, according to your health care provider's advice. For example, you might walk, bike, or swim at least 30 minutes 3 to 5 times a week. . Limit the amount of alcohol you drink. Moderate drinking means up to 1 drink a day for women and up to 2 drinks for men. A drink equals 12 ounces of regular beer, 5 ounces of wine, or 1 and 1/2 ounces of 80-proof distilled spirits such as whiskey or vodka. . Limit the amount of caffeine you drink. . Try  to reduce the stress in your life or learn how to deal better with situations that make you feel anxious. . Ask your health care provider or pharmacist for information about the drugs you are taking. . Lose weight if you need to. . Tell your health care provider about any side effects you have from your medicines.   Adult Health Advisor [phone]; Copyright  2006 [employer] and/or one of its subsidiaries. All Rights Reserved. Developed by Minta Balsam. Gaye Pollack, MD; Thana Ates. Excell Seltzer, RN, [place]; and [employer]. This content is reviewed periodically and is subject to change as new health information becomes available. The information is intended to inform and educate and is not a replacement for medical evaluation, advice, diagnosis or treatment by a healthcare professional.  The DASH Diet  Dietary Approaches to Stop Hypertension   What is hypertension?  Hypertension is the term for blood pressure that is consistently higher than normal. Blood pressure is the force of blood against artery walls. Blood pressure can be unhealthy if it is above 120/80. The higher your blood pressure, the greater the health risk. High blood pressure can be controlled if you take these steps:  Marland Kitchen Maintain a healthy weight.  . Be physically active.  . Follow a healthy eating plan, which includes foods lower  in salt and sodium.  . If you drink alcoholic beverages, do so in moderation.  As noted in this list, diet affects high blood pressure. Following the DASH diet and reducing the amount of sodium in your diet will help lower your blood pressure. It will also help prevent high blood pressure.   What is the DASH diet?  Dietary Approaches to Stop Hypertension (DASH) is a diet that is low in saturated fat, cholesterol, and total fat. It emphasizes fruits, vegetables, and low-fat dairy foods. The DASH diet also includes whole-grain products, fish, poultry, and nuts. It encourages fewer servings of red  meat, sweets, and sugar-containing beverages. It is rich in magnesium, potassium, and calcium, as well as protein and fiber.   How do I get started on the DASH diet?  The DASH diet requires no special foods and has no hard-to-follow recipes. Start by seeing how DASH compares with your current eating habits. The DASH eating plan shown is based on 2,000 calories a day.   Your health care provider or a dietitian can help you determine how many calories a day you need. Most adults need somewhere between 1600 and 2800 calories a day. Serving sizes will vary between 1/2 cup and 1 1/4 cups. Check the product's nutrition label to determine serving sizes of particular products.  Type of Food Servings for Diet of 2000 Calories/Day  Grains/Grain products (include at least 3 whole grain foods each day) 7-8  Fruits 4-5  Vegetables 4-5  Low fat or non-fat dairy foods 2-3  Lean meats, fish, poultry 2 or less  Nuts, seeds, and legumes 4-5/week  Fats and sweets Limited   Make changes gradually.  Here are some suggestions that might help:  . If you now eat 1 or 2 servings of vegetables a day, add a serving at lunch and another at dinner.  . If you don't eat fruit now or have only juice at breakfast, add a serving to your meals or have it as a snack.  . Drink milk or water with lunch or dinner instead of soda, sugar sweetened tea, or alcohol. Choose low-fat (1%) or fat-free (skim) dairy products to reduce how much saturated fat, total fat, cholesterol, and calories you eat.  . If you have trouble digesting dairy products, try taking lactase enzyme pills or drops (available at drugstores and groceries) with the dairy foods. Or buy lactose-free milk or milk with lactase enzyme added to it.  . Read food labels on margarines and salad dressings to choose products lowest in fat.  . If you now eat large portions of meat, cut back gradually--by a half or a third at each meal. Limit meat to 6 ounces a day (2 servings).  Three to four ounces is about the size of a deck of cards.  . Have 2 or more vegetarian-style (meatless) meals each week.   Increase servings of vegetables, rice, pasta, and beans in all meals.  Try casseroles and pasta, and stir-fry dishes, which have less meat and more vegetables, grains, and beans.  . Use fruits canned in their own juice. Fresh fruits require little or no preparation. Dried fruits are a good choice to carry with you or to have ready in the car.  . Try these snacks ideas: unsalted pretzels or nuts mixed with raisins, graham crackers, low-fat and fat-free yogurt and frozen yogurt, popcorn with no salt or butter added, and raw vegetables.  . Choose whole grain foods to get more nutrients, including minerals and  fiber. For example, choose whole-wheat bread or whole-grain cereals.  . Use fresh, frozen, or no-salt-added canned vegetables.   Remember to also reduce the salt and sodium in your diet. Try to have no more than 2000 milligrams (mg) of sodium per day, with a goal of further reducing the sodium to 1500 mg per day. Three important ways to reduce sodium are:  . Use reduced-sodium or no-salt-added food products.  . Use less salt when you prepare foods and do not add salt to your food at the table.  . Read fool labels. Aim for foods that are less than 5 percent of the daily value of sodium.   The DASH eating plan was not designed for weight loss. But it contains many lower calorie foods, such as fruits and vegetables. You can make it lower in calories by replacing higher calorie foods with more fruits and vegetables. Some ideas to increase fruits and vegetables and decrease calories include:  . Eat a medium apple instead of four shortbread cookies. You'll save 80 calories.  . Eat 1/4 cup of dried apricots instead of a 2-ounce bag of pork rinds. You'll save 230 calories.  . Have a hamburger that's 3 ounces instead of 6 ounces. Add a  cup serving of carrots and a 1/2 cup serving  of spinach. You'll save more than 200 calories.  . Instead of 5 ounces of chicken, have a stir fry with 2 ounces ofchicken and 1 and 1/2 cups of raw vegetables. Use a small amount of vegetable oil. You'll save 50 calories.  . Have a 1/2 cup serving of low-fat frozen yogurt instead of a 1 and 1/2 ounce milk chocolate bar. You'll save about 110 calories.  . Use low-fat or fat-free condiments, such as fat free salad dressings.  . Eat smaller portions--cut back gradually.  . Use food labels to compare fat content in packaged foods. Items marked low-fat or fat-free may be lower in fat without being lower in calories than their regular versions.  . Limit foods with lots of added sugar, such as pies, flavored yogurts, candy bars, ice cream, sherbet, regular soft drinks, and fruit drinks.  . Drink water or club soda instead of cola or other soda drinks.

## 2017-03-27 ENCOUNTER — Ambulatory Visit (INDEPENDENT_AMBULATORY_CARE_PROVIDER_SITE_OTHER): Payer: No Typology Code available for payment source

## 2017-03-27 ENCOUNTER — Ambulatory Visit
Admission: EM | Admit: 2017-03-27 | Discharge: 2017-03-27 | Disposition: A | Discharge status: Home / Self Care | Payer: No Typology Code available for payment source | Attending: Family Medicine | Admitting: Family Medicine

## 2017-03-27 DIAGNOSIS — M79645 Pain in left finger(s): Secondary | ICD-10-CM

## 2017-03-27 DIAGNOSIS — S61032A Puncture wound without foreign body of left thumb without damage to nail, initial encounter: Secondary | ICD-10-CM

## 2017-03-27 DIAGNOSIS — Z23 Encounter for immunization: Secondary | ICD-10-CM

## 2017-03-27 DIAGNOSIS — W298XXA Contact with other powered powered hand tools and household machinery, initial encounter: Secondary | ICD-10-CM

## 2017-03-27 MED ORDER — MUPIROCIN 2 % EX OINT: TOPICAL_OINTMENT | CUTANEOUS | 0 refills | Status: AC

## 2017-03-27 MED ORDER — CEPHALEXIN 500 MG PO CAPS: 500.0000 mg | ORAL_CAPSULE | Freq: Four times a day (QID) | ORAL | 0 refills | Status: AC

## 2017-03-27 MED ORDER — TETANUS-DIPHTH-ACELL PERTUSSIS 5-2.5-18.5 LF-MCG/0.5 IM SUSP
0.5000 mL | Freq: Once | INTRAMUSCULAR | Status: AC
  Administered 2017-03-27: 0.5 mL via INTRAMUSCULAR

## 2017-03-27 NOTE — ED Triage Notes (Signed)
Pt was building a shed and was using a drill and the drill penetrated his left thumb all the way through about 1.5 hours ago.

## 2017-03-27 NOTE — Discharge Instructions (Signed)
Take medication as prescribed. Rest. Drink plenty of fluids. Keep clean. Clean daily at home with soap and water. Monitor closely.   Follow up with your primary care physician in 3-4 days for wound recheck and injury recheck. Return to Urgent care for redness, drainage, swelling, new or worsening concerns.

## 2017-03-27 NOTE — ED Provider Notes (Signed)
[facility] ____________________________________________  Time seen: Approximately 11:35 AM  I have reviewed the triage vital signs and the nursing notes.   HISTORY  Chief Complaint Finger Injury   HPI Mason Howard is a 46 y.o. male presents for evaluation of left thumb pain after an injury that occurred just prior to arrival. Patient reports that he was working at home, using a drill with bit attached and while drilling through the wood, the bit went through his finger. Reports moderate pain to left thumb at this time. Denies other pain or injuries. Denies fall to ground or head injury. States cleaned and put dressing on wound prior to arrival. No other alleviating measures attempted.States pain mostly with direct palpation. Reports thinks his last tetanus was last year, but not positive. Denies paresthesias, decreased range of motion, or other pain. Reports right hand dominant. Denies chronic medical problems. Denies cardiac history of renal insufficiency. Reports otherwise feels well. Denies chest pain, shortness of breath, abdominal pain. Denies recent sickness. Denies recent antibiotic use.  PCP: Yetta Barre    Past Medical History:  Diagnosis Date  . Appendicitis 08/31/2016    Patient Active Problem List   Diagnosis Date Noted  . S/P laparoscopic appendectomy 09/10/2016  . H/O appendicitis 09/10/2016    Past Surgical History:  Procedure Laterality Date  . LAPAROSCOPIC APPENDECTOMY N/A 08/31/2016   Procedure: APPENDECTOMY LAPAROSCOPIC;  Surgeon: Henrene Dodge, MD;  Location: [facility] ORS;  Service: General;  Laterality: N/A;     No current facility-administered medications for this encounter.   Current Outpatient Prescriptions:  .  cephALEXin (KEFLEX) 500 MG capsule, Take 1 capsule (500 mg total) by mouth 4 (four) times daily., Disp: 28 capsule, Rfl: 0 .  mupirocin ointment (BACTROBAN) 2 %, Apply three times a day for 7 days., Disp: 22 g, Rfl:  0  Allergies Patient has no known allergies.  Family History  Problem Relation Age of Onset  . Healthy Mother   . Healthy Father     Social History Social History  Substance Use Topics  . Smoking status: Never Smoker  . Smokeless tobacco: Never Used  . Alcohol use No    Review of Systems Constitutional: No fever/chills Eyes: No visual changes. ENT: No sore throat. Cardiovascular: Denies chest pain. Respiratory: Denies shortness of breath. Gastrointestinal: No abdominal pain.  No nausea, no vomiting.  No diarrhea.  No constipation. Genitourinary: Negative for dysuria. Musculoskeletal: Negative for back pain. Skin: As above.   ____________________________________________   PHYSICAL EXAM:  VITAL SIGNS: ED Triage Vitals [03/27/17 1038]  Enc Vitals Group     BP (!) 143/92     Pulse Rate 61     Resp 18     Temp 98.1 F (36.7 C)     Temp Source Oral     SpO2 100 %     Weight 200 lb (90.7 kg)     Height 5\' 7"  (1.702 m)     Head Circumference      Peak Flow      Pain Score 2     Pain Loc      Pain Edu?      Excl. in GC?     Constitutional: Alert and oriented. Well appearing and in no acute distress.. Cardiovascular: Normal rate, regular rhythm. Grossly normal heart sounds.  Good peripheral circulation. Respiratory: Normal respiratory effort without tachypnea nor retractions. Breath sounds are clear and equal bilaterally. No wheezes, rales, rhonchi. Musculoskeletal:  Steady gait. See skin below.  Neurologic:  Normal  speech and language. No gross focal neurologic deficits are appreciated. Speech is normal. No gait instability.  Skin:  Skin is warm, dry. Except: left Distal thumb medial aspect of the distal fat pad insertion point with puncture wound mild to moderately tenderness to palpation, no active bleeding, no erythema, no drainage, appearance of exit unsure wound present to dorsal aspect of the distal thumb just distal to the PIP joint with mild tenderness to  palpation without erythema, mild active bleeding. No appearance of foreign bodies. Left thumb with good distal resisted flexion and extension, normal distal sensation and capillary refill, no motor or tendon deficit noted. Left hand otherwise nontender.  Psychiatric: Mood and affect are normal. Speech and behavior are normal. Patient exhibits appropriate insight and judgment   ___________________________________________   LABS (all labs ordered are listed, but only abnormal results are displayed)  Labs Reviewed - No data to display ____________________________________________  RADIOLOGY  Dg Finger Thumb Left  Result Date: 03/27/2017 CLINICAL DATA:  Recent drill accident in distal thumb EXAM: LEFT THUMB 2+V COMPARISON:  None. FINDINGS: Soft tissue irregularity is noted consistent with the patient's given clinical history. No definitive bony abnormality is seen. No radiopaque foreign body is noted. IMPRESSION: Soft tissue changes consistent with the recent injury. No bony abnormality is seen. Electronically Signed   By: Alcide CleverMark  Lukens M.D.   On: 03/27/2017 11:41   ____________________________________________   PROCEDURES Procedures   Procedure(s) performed:  Procedure explained and verbal consent obtained. Consent: Verbal consent obtained. Written consent not obtained. Risks and benefits: risks, benefits and alternatives were discussed Patient identity confirmed: verbally with patient and hospital-assigned identification number  Consent given by: patient   Laceration Repair Location: Left thumb Length: Puncture wound 2 Foreign bodies: no foreign bodies visualized Tendon involvement: none noted  Nerve involvement: none Preparation: Patient was prepped and draped in the usual sterile fashion. Anesthesia none Cleaned with Betadine Irrigation solution: Sterile water  Irrigation method: jet lavage Amount of cleaning: copious No repair indicated Patient tolerate well.   Antibiotic  ointment and dressing applied.  Wound care instructions provided.  Observe for any signs of infection or other problems.      INITIAL IMPRESSION / ASSESSMENT AND PLAN / ED COURSE  Pertinent labs & imaging results that were available during my care of the patient were reviewed by me and considered in my medical decision making (see chart for details).  Very well-appearing patient. No acute distress. Through and through puncture wound injury from metal drill bill. Tenderness along wound sites, will evaluate x-ray for bone involvement as well as foreign body. No motor or tendon deficit noted on exam. Tetanus immunization updated. Left thumb x-ray reviewed, per radiologist soft tissue changes consistent with recent injury, no radiopaque foreign body noted, no bony abnormality seen. Will treat pro phylactically with oral Keflex and topical Bactroban. Discussed strict cleaning and monitoring. Encouraged follow-up in 3 days for wound check and range of motion reevaluation. Discussed sooner return parameters including redness, drainage, fever, pain or concerns infection or other concerns.Discussed indication, risks and benefits of medications with patient.  Discussed follow up with Primary care physician this week. Discussed follow up and return parameters including no resolution or any worsening concerns. Patient verbalized understanding and agreed to plan.   ____________________________________________   FINAL CLINICAL IMPRESSION(S) / ED DIAGNOSES  Final diagnoses:  Puncture wound of left thumb, initial encounter  Thumb pain, left     Discharge Medication List as of 03/27/2017 12:00 PM  START taking these medications   Details  cephALEXin (KEFLEX) 500 MG capsule Take 1 capsule (500 mg total) by mouth 4 (four) times daily., Starting Thu 03/27/2017, Until Thu 04/03/2017, Normal    mupirocin ointment (BACTROBAN) 2 % Apply three times a day for 7 days., Normal        Note: This dictation was  prepared with Dragon dictation along with smaller phrase technology. Any transcriptional errors that result from this process are unintentional.         Renford Dills, NP 03/27/17 1256

## 2017-10-27 IMAGING — CT CT ABD-PELV W/ CM
2 of 5 series · 15 of 46 positions shown, 17 images · IV contrast (APPLIED)
Comparison: None.

CLINICAL DATA: Generalized abdominal pain and nausea. Epigastric
pain and abdominal bloating.

EXAM:
CT ABDOMEN AND PELVIS WITH CONTRAST
TECHNIQUE: Multidetector CT imaging of the abdomen and pelvis was performed
using the standard protocol following bolus administration of
intravenous contrast.
CONTRAST:  100mL XFWM6P-A77 IOPAMIDOL (XFWM6P-A77) INJECTION 61%

[Series 2: routine abd/pel with · axial · 0.78mm/px · z∈[-841,-401]mm · 12 of 100 slices shown, 14 images]
[im 6/100  soft-tissue]
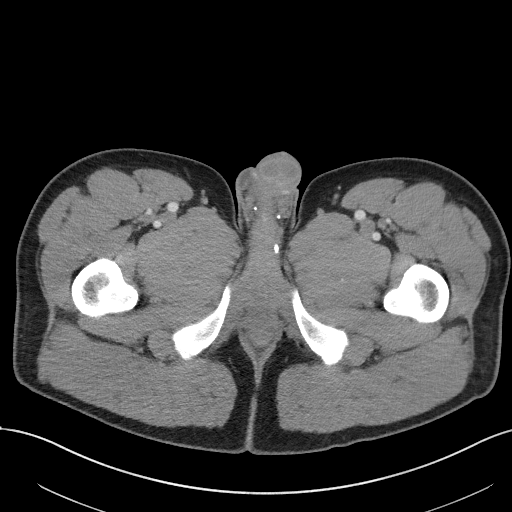
[im 6/100  bone]
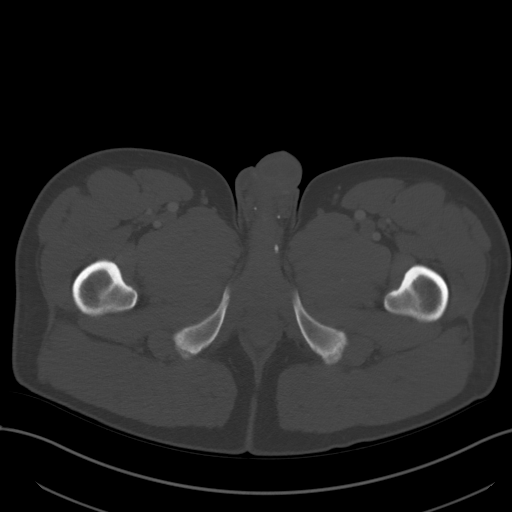
[im 16/100  soft-tissue]
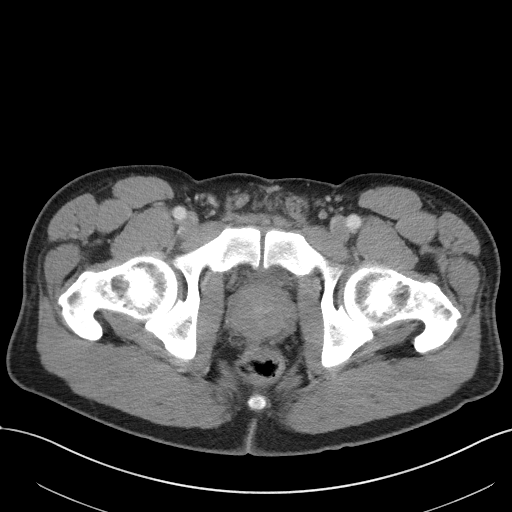
[im 21/100  soft-tissue]
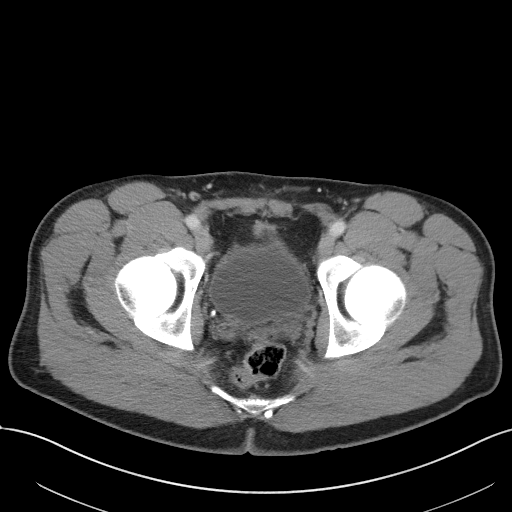
[im 32/100  soft-tissue]
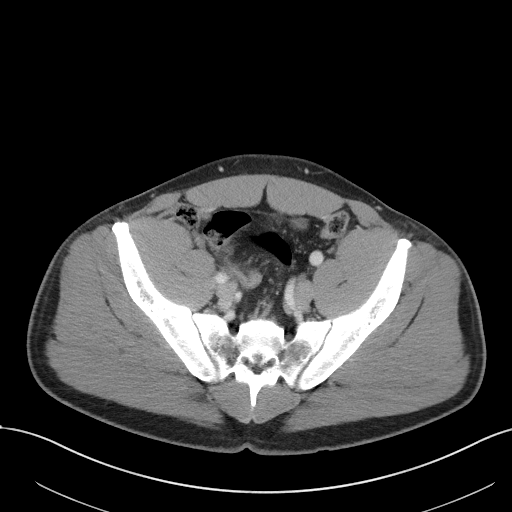
[im 37/100  soft-tissue]
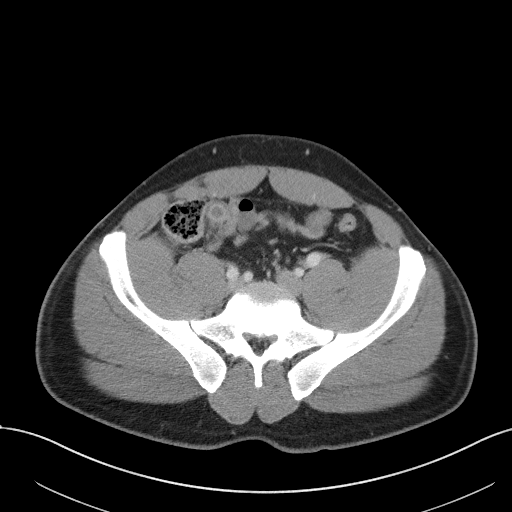
[im 47/100  soft-tissue]
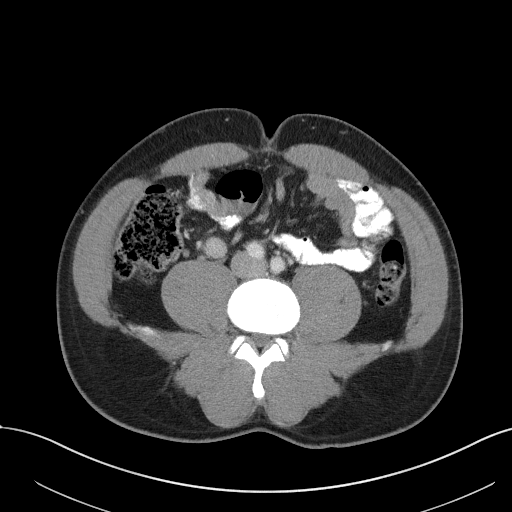
[im 53/100  soft-tissue]
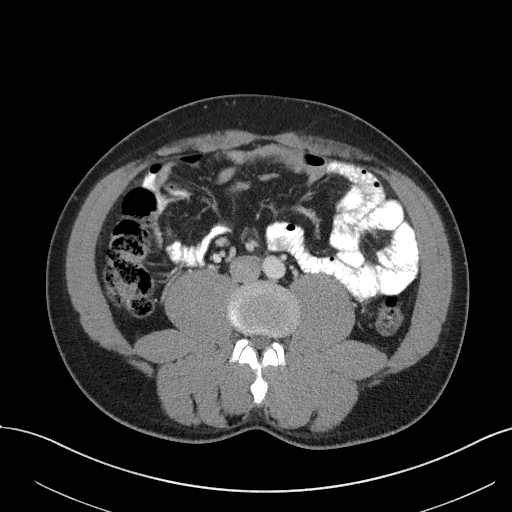
[im 63/100  soft-tissue]
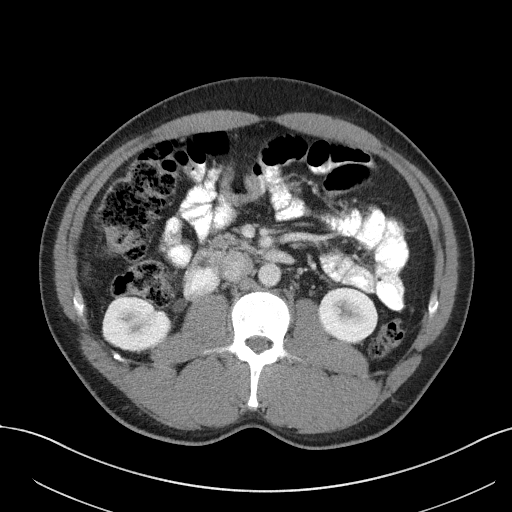
[im 68/100  soft-tissue]
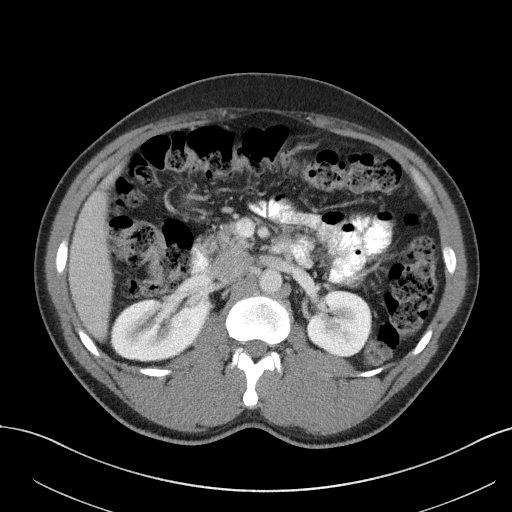
[im 68/100  bone]
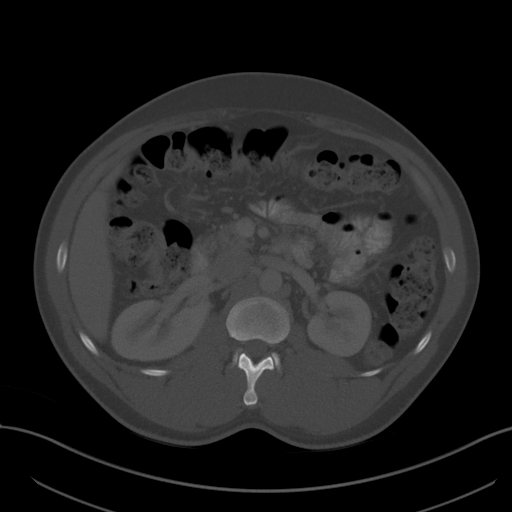
[im 79/100  soft-tissue]
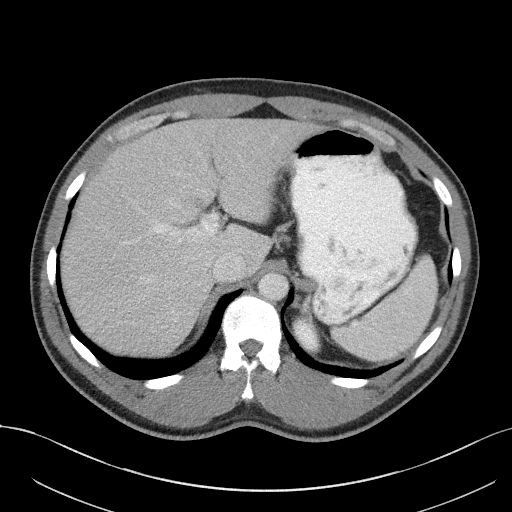
[im 84/100  soft-tissue]
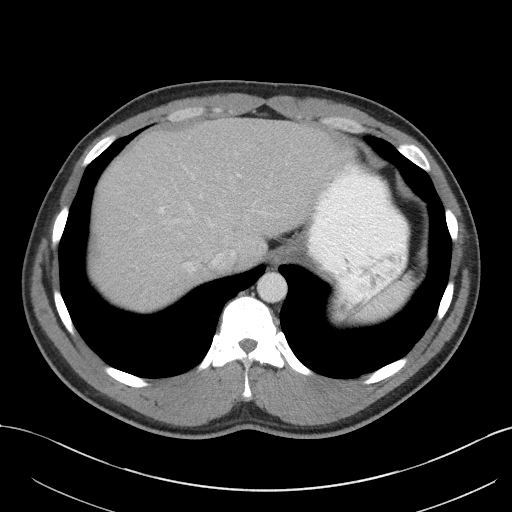
[im 94/100  soft-tissue]
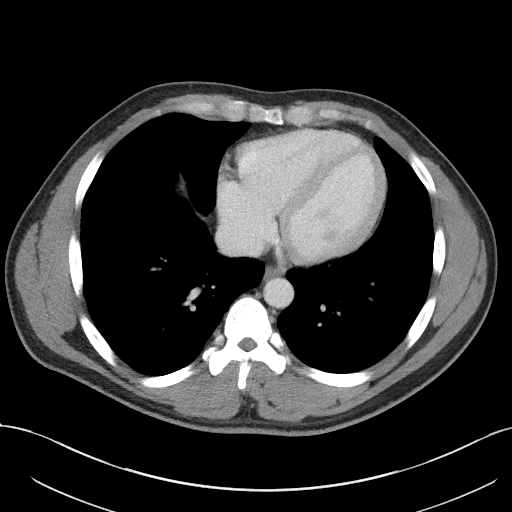

[Series 6: coronal st · coronal · 0.74mm/px · 3 of 93 slices shown]
[im 31/93  soft-tissue]
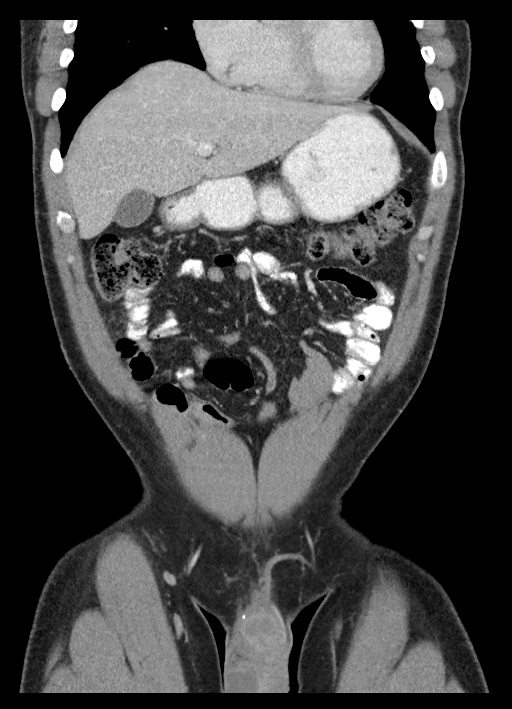
[im 41/93  soft-tissue]
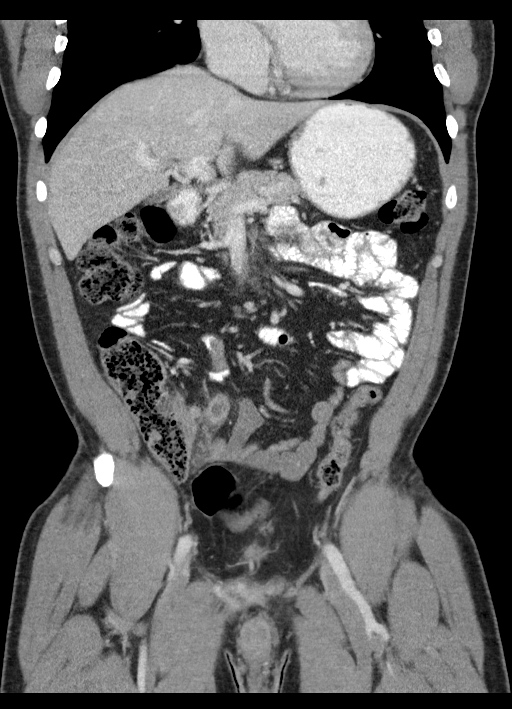
[im 52/93  soft-tissue]
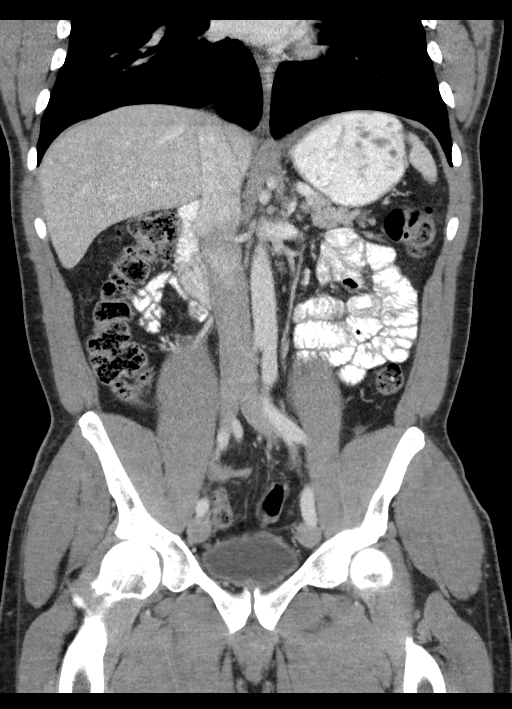

[15 of 46 positions shown; findings below may reference images not displayed]

FINDINGS: Lower chest: Mild dependent changes in the lung bases.

Hepatobiliary: No focal liver abnormality is seen. No gallstones,
gallbladder wall thickening, or biliary dilatation.

Pancreas: Unremarkable. No pancreatic ductal dilatation or
surrounding inflammatory changes.

Spleen: Normal in size without focal abnormality.

Adrenals/Urinary Tract: Adrenal glands are unremarkable. Kidneys are
normal, without renal calculi, focal lesion, or hydronephrosis.
Bladder is unremarkable.

Stomach/Bowel: Fluid-filled distended appendix at 14 mm with small
appendicolith and stranding around the periappendiceal fat.
Appearance is consistent with acute appendicitis. No abscess.
Enlarged right lower quadrant lymph nodes are likely reactive. Small
amount of free fluid in the pelvis is also likely reactive.
Stool-filled colon without abnormal distention or wall thickening.
Stomach and small bowel are unremarkable.

Vascular/Lymphatic: Normal caliber abdominal aorta and IVC. No
retroperitoneal lymphadenopathy.

Reproductive: Prostate is enlarged, measuring 4.4 cm.

Other: No free air in the abdomen. Abdominal wall musculature
appears intact.

Musculoskeletal: No acute or significant osseous findings.
IMPRESSION: Distended fluid-filled appendix with small at appendicolith and
periappendiceal stranding consistent with acute appendicitis.
Enlarged right lower quadrant lymph nodes and free pelvic fluid are
likely reactive.

## 2018-02-12 ENCOUNTER — Inpatient Hospital Stay
Admit: 2018-02-12 | Discharge: 2018-02-12 | Disposition: A | Discharge status: Home / Self Care | Payer: Self-pay | Attending: Emergency Medicine

## 2018-02-12 DIAGNOSIS — S0086XA Insect bite (nonvenomous) of other part of head, initial encounter: Secondary | ICD-10-CM

## 2018-02-12 MED ORDER — HYDROXYZINE 50 MG TAB
50 mg | ORAL_TABLET | Freq: Four times a day (QID) | ORAL | 0 refills | Status: AC | PRN
Start: 2018-02-12 — End: 2018-02-22

## 2018-02-12 NOTE — ED Provider Notes (Signed)
ED Provider Notes by Ahmed Prima'Leary, Brennen Camper, PA-C at 02/12/18 [id number]                Author: Ahmed Prima'Leary, Yaslin Kirtley, PA-C  Service: EMERGENCY  Author Type: Physician Assistant       Filed: 02/12/18 0618  Date of Service: 02/12/18 0312  Status: Attested           Editor: Ahmed Prima'Leary, Breanne Olvera, PA-C (Physician Assistant)  Cosigner: Jerilynn SomHsu, Helen H, MD at 02/19/18 [phone]          Attestation signed by Jerilynn SomHsu, Helen H, MD at 02/19/18 Oletta [id number]          I, Helen Hsu MD, have discussed the history, physical exam findings, relevant laboratory/radiology findings and plan with the midlevel provider.  I have been  available at all times and agree with the management and disposition documented.      Dianna RossettiHelen Hsu, MD   February 19, 2018                                       [facility]   Emergency Department Treatment Report          Patient: Mario Carmineicholas Wright  Age: 47 y.o.  Sex: male          Date of Birth: 07/30/1971  Admit Date: 02/12/2018  PCP: None     MRN: [id number]   CSN: [id number]   Attending: Jerilynn SomHSU, HELEN H, MD         Room: ER40/ER40  Time Dictated: 3:12 AM  APP: Ahmed Primaolleen O'Leary, PA-C        Chief Complaint      Chief Complaint       Patient presents with        ?  Allergic Reaction             History of Present Illness     47 y.o. male  complains of itchy red bumps to forehead and extremities since Tuesday morning.  He was outside a lot on Monday.  [place] from [place].  He is in town for work.  Since Tuesday evening [facility] [place].  The  rash occurred before he stayed in the hotel.  No new exposures.  No recent change in personal hygiene products. He has been taking Benadryl with no relief.         Review of Systems     Constitutional: No fever, chills   ENT: No swelling to lips or tongue.   RESP: No shortness of breath   CV: No chest pain   GI: No nausea, vomiting, diarrhea, abdominal pain   SKIN: See above.    Denies complaints in all other systems.        Past Medical/Surgical History      History reviewed. No pertinent past medical history.   History reviewed. No pertinent surgical history.   None.         Social History          Social History          Socioeconomic History         ?  Marital status:  MARRIED              Spouse name:  Not on file         ?  Number of children:  Not on file     ?  Years of education:  Not on file     ?  Highest education level:  Not on file       Tobacco Use         ?  Smoking status:  Never Smoker     ?  Smokeless tobacco:  Never Used       Substance and Sexual Activity         ?  Alcohol use:  Not Currently         ?  Drug use:  Not Currently             Family History     History reviewed. No pertinent family history.        Current Medications          None          Allergies     No Known Allergies     Physical Exam          ED Triage Vitals     ED Encounter Vitals Group            BP  02/12/18 0225  (!) 139/97        Pulse (Heart Rate)  02/12/18 0225  70        Resp Rate  02/12/18 0225  16        Temp  02/12/18 0225  98.3 ??F (36.8 ??C)        Temp src  --          O2 Sat (%)  02/12/18 0225  98 %        Weight  02/12/18 0259  200 lb            Height  02/12/18 0259  5\' 7"         Constitutional: Well developed, well nourished.    EYES: Conjuctivae clear, lids normal.  PERRLA   ENT: Oral mucosa is pink, moist and without lesions.   RESP: Lungs clear bilaterally.    CV: Regular rate and rhythm.  No murmurs.   MSK: Moves all 4 extremities spontaneously.    SKIN: Insect bite to right side of forehead, right side of neck, left forearm, dorsal aspect of left hand, right forearm, left lateral ankle;  no associated erythema, increased warmth, or tenderness.  No rash in webspaces of fingers or toes. No rash to palms or soles of feet.    NEURO: Alert, oriented.         Impression and Management Plan     Patient with multiple insect bites.  There is no secondary infection and he is not having an allergic reaction.  He has had no relief with Benadryl so will prescribe   hydroxyzine.        Diagnostic Studies     Lab:    No results found for this visit on 02/12/18.        ED Course/Medical Decision Making     Hydroxyzine prescription given.  Patient instructed to follow-up with referral PCP in 1 week for recheck.  Return to ED immediately for new or worsening symptoms.        Final Diagnosis                 ICD-10-CM  ICD-9-CM          1.  Multiple insect bites  W57.XXXA  919.4             [id number]  Disposition     Home in stable condition.       Antoine Primas, PA-C   February 12, 2018      The patient was fully discussed with attending HSU, Acquanetta Belling, MD who agrees with the above assessment and plan.      Nursing notes have been reviewed by the physician/ advanced practice Clinician.      Dragon medical dictation was used for portions of this report. Unintended grammatical errors may occur.      My signature above authenticates this document and my orders, the final diagnosis (es), discharge prescription (s), and instructions in the Epic record. If you have any questions  please contact [phone].   ??

## 2018-02-12 NOTE — ED Notes (Signed)
Pt reports having itchy bumps all over body started on Monday. Tuesday he went to buy Benadryl but has not been working and bumps are getting bigger. No known allergies. No shortness of breath or lip swelling. Denies using new soap or detergents. Remembered eating pizza and chicken on Monday.

## 2018-02-12 NOTE — ED Triage Notes (Signed)
Pt reports having itchy bumps all over body started on Monday. Tuesday he went to buy Benadryl but has not been working and bumps are getting bigger. No known allergies. No shortness of breath or lip swelling. Denies using new soap or detergents. Remembered eating pizza and chicken on Monday.

## 2018-02-12 NOTE — ED Provider Notes (Signed)
[facility]  Emergency Department Treatment Report    Patient: Mario Wright Age: 47 y.o. Sex: male    Date of Birth: 06-12-1971 Admit Date: 02/12/2018 PCP: None   MRN: [id number]  CSN: [id number]  Attending: Jerilynn Som, MD   Room: ER40/ER40 Time Dictated: 3:12 AM APP: Ahmed Prima, PA-C     Chief Complaint   Chief Complaint   Patient presents with   ??? Allergic Reaction       History of Present Illness   47 y.o. male complains of itchy red bumps to forehead and extremities since Tuesday morning.  He was outside a lot on Monday.  He is from [place].  He is in town for work.  Since Tuesday evening he has been staying at the [facility] in [place].  The rash occurred before he stayed in the hotel.  No new exposures.  No recent change in personal hygiene products. He has been taking Benadryl with no relief.     Review of Systems   Constitutional: No fever, chills  ENT: No swelling to lips or tongue.  RESP: No shortness of breath  CV: No chest pain  GI: No nausea, vomiting, diarrhea, abdominal pain  SKIN: See above.   Denies complaints in all other systems.    Past Medical/Surgical History   History reviewed. No pertinent past medical history.  History reviewed. No pertinent surgical history.  None.     Social History     Social History     Socioeconomic History   ??? Marital status: MARRIED     Spouse name: Not on file   ??? Number of children: Not on file   ??? Years of education: Not on file   ??? Highest education level: Not on file   Tobacco Use   ??? Smoking status: Never Smoker   ??? Smokeless tobacco: Never Used   Substance and Sexual Activity   ??? Alcohol use: Not Currently   ??? Drug use: Not Currently       Family History   History reviewed. No pertinent family history.    Current Medications     None     Allergies   No Known Allergies  Physical Exam     ED Triage Vitals   ED Encounter Vitals Group      BP 02/12/18 0225 (!) 139/97      Pulse (Heart Rate) 02/12/18 0225 70       Resp Rate 02/12/18 0225 16      Temp 02/12/18 0225 98.3 ??F (36.8 ??C)      Temp src --       O2 Sat (%) 02/12/18 0225 98 %      Weight 02/12/18 0259 200 lb      Height 02/12/18 0259 5\' 7"      Constitutional: Well developed, well nourished.   EYES: Conjuctivae clear, lids normal.  PERRLA  ENT: Oral mucosa is pink, moist and without lesions.  RESP: Lungs clear bilaterally.   CV: Regular rate and rhythm.  No murmurs.  MSK: Moves all 4 extremities spontaneously.   SKIN: Insect bite to right side of forehead, right side of neck, left forearm, dorsal aspect of left hand, right forearm, left lateral ankle; no associated erythema, increased warmth, or tenderness.  No rash in webspaces of fingers or toes. No rash to palms or soles of feet.   NEURO: Alert, oriented.     Impression and Management Plan   Patient with multiple insect bites.  There is no secondary infection and he is not having an allergic reaction.  He has had no relief with Benadryl so will prescribe hydroxyzine.    Diagnostic Studies   Lab:   No results found for this visit on 02/12/18.    ED Course/Medical Decision Making   Hydroxyzine prescription given.  Patient instructed to follow-up with referral PCP in 1 week for recheck.  Return to ED immediately for new or worsening symptoms.    Final Diagnosis       ICD-10-CM ICD-9-CM   1. Multiple insect bites W57.XXXA 919.4     E906.4       Disposition   Home in stable condition.     Ahmed Primaolleen O'Leary, PA-C  February 12, 2018    The patient was fully discussed with attending HSU, Oleta MouseHELEN H, MD who agrees with the above assessment and plan.    Nursing notes have been reviewed by the physician/ advanced practice Clinician.    Dragon medical dictation was used for portions of this report. Unintended grammatical errors may occur.    My signature above authenticates this document and my orders, the final diagnosis (es), discharge prescription (s), and instructions in the Epic  record. If you have any questions please contact [phone].  ??

## 2018-05-23 IMAGING — CR DG FINGER THUMB 2+V*L*
3 series · 3 of 3 positions shown · non-contrast
Comparison: None.

CLINICAL DATA: Recent drill accident in distal thumb

EXAM:
LEFT THUMB 2+V

[finger ap]
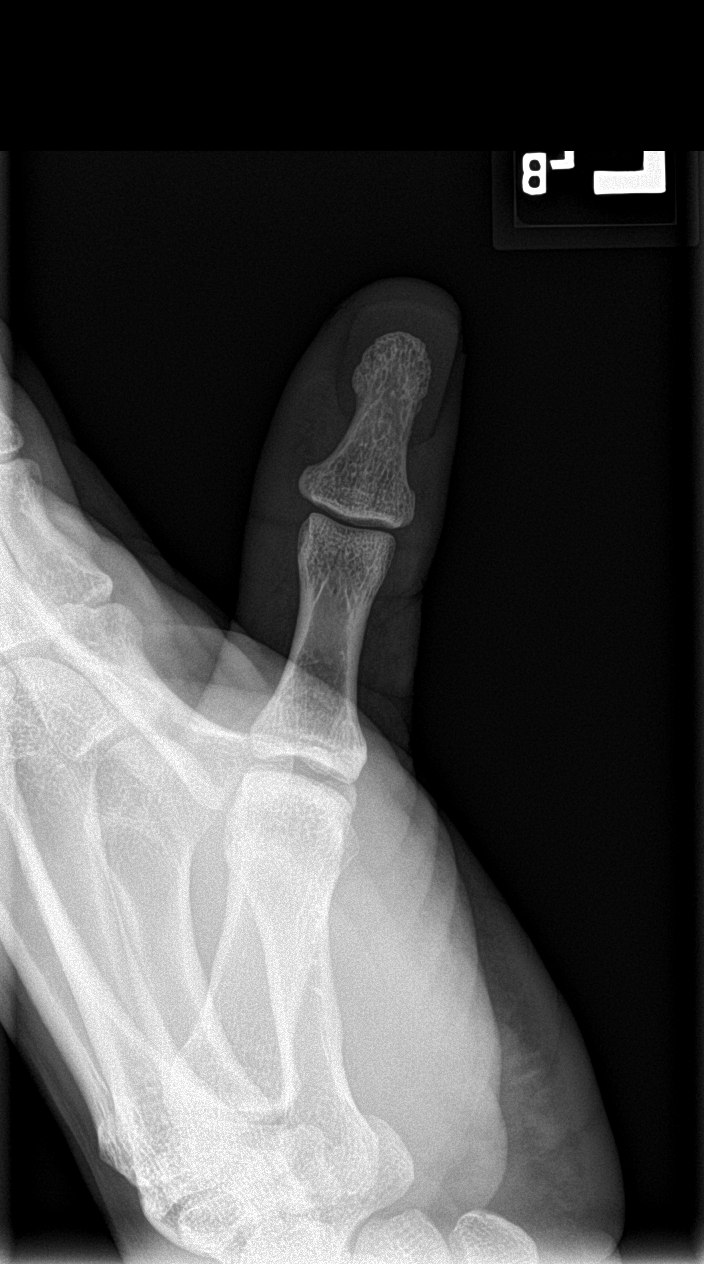

[finger obl]
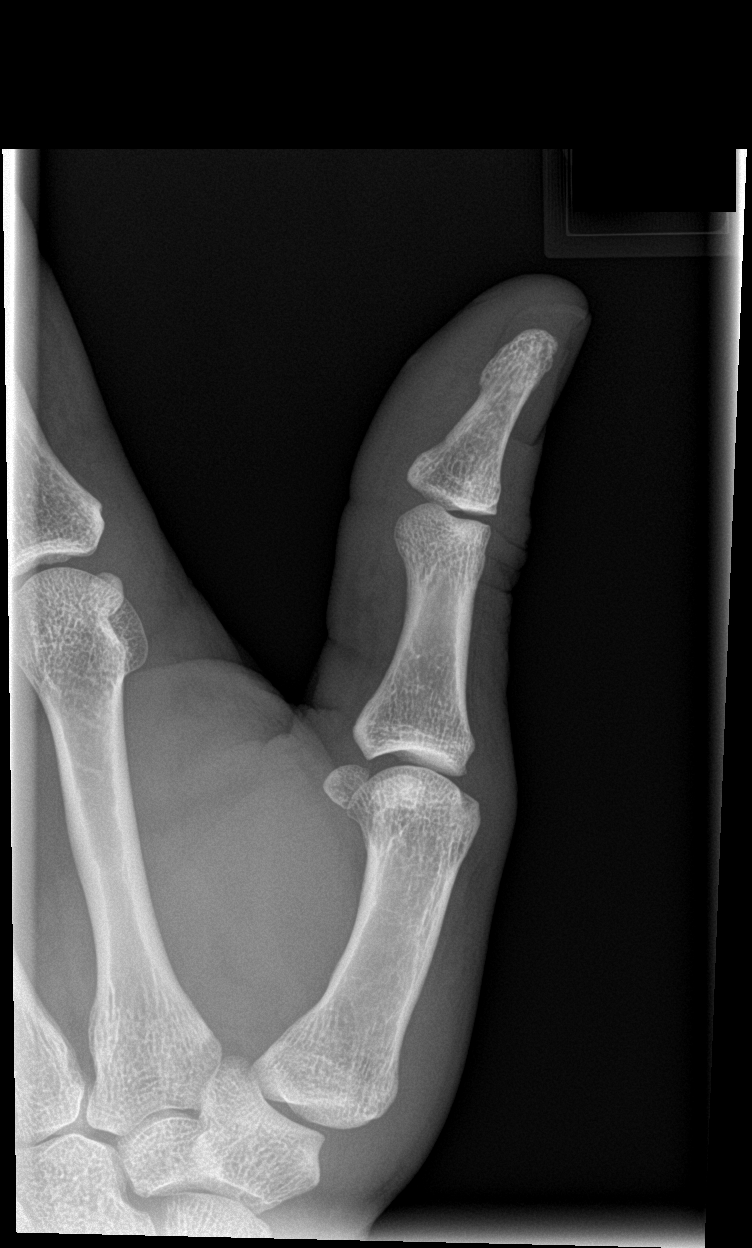

[finger lat]
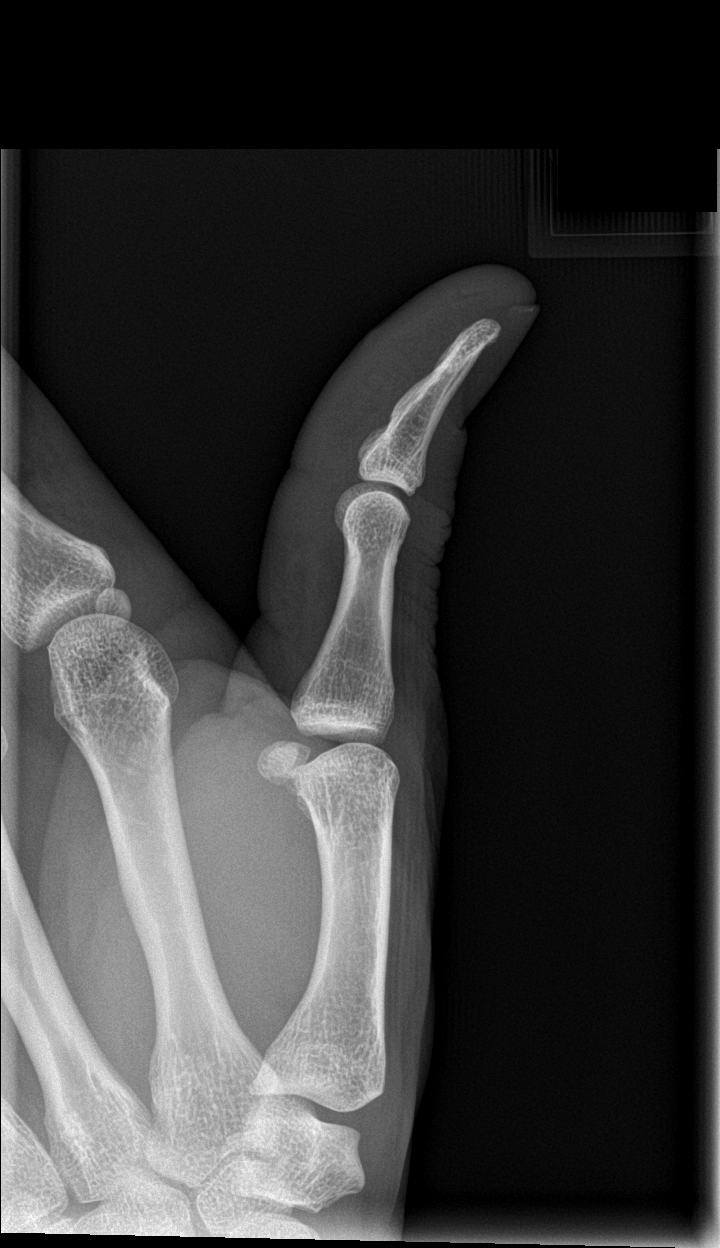

[3 of 3 positions shown; findings below may reference images not displayed]

FINDINGS: Soft tissue irregularity is noted consistent with the patient's
given clinical history. No definitive bony abnormality is seen. No
radiopaque foreign body is noted.
IMPRESSION: Soft tissue changes consistent with the recent injury. No bony
abnormality is seen.

## 2019-03-22 ENCOUNTER — Ambulatory Visit: Payer: Self-pay
